# Patient Record
Sex: Female | Born: 1990 | Race: Black or African American | Hispanic: No | Marital: Single | Smoking: Never smoker
Health system: Southern US, Community
[De-identification: ages and names within clinical notes are randomized; demographics above are authoritative.]

## PROBLEM LIST (undated history)

## (undated) DIAGNOSIS — I10 Essential (primary) hypertension: Secondary | ICD-10-CM

## (undated) DIAGNOSIS — B009 Herpesviral infection, unspecified: Secondary | ICD-10-CM

## (undated) HISTORY — PX: WISDOM TOOTH EXTRACTION: SHX21

---

## 2013-09-05 DIAGNOSIS — A499 Bacterial infection, unspecified: Secondary | ICD-10-CM | POA: Insufficient documentation

## 2013-09-05 DIAGNOSIS — Z3202 Encounter for pregnancy test, result negative: Secondary | ICD-10-CM | POA: Insufficient documentation

## 2013-09-05 DIAGNOSIS — N760 Acute vaginitis: Secondary | ICD-10-CM | POA: Insufficient documentation

## 2013-09-05 DIAGNOSIS — B373 Candidiasis of vulva and vagina: Secondary | ICD-10-CM | POA: Insufficient documentation

## 2013-09-05 DIAGNOSIS — B9689 Other specified bacterial agents as the cause of diseases classified elsewhere: Secondary | ICD-10-CM | POA: Insufficient documentation

## 2013-09-05 DIAGNOSIS — N9089 Other specified noninflammatory disorders of vulva and perineum: Secondary | ICD-10-CM | POA: Insufficient documentation

## 2013-09-05 DIAGNOSIS — Z202 Contact with and (suspected) exposure to infections with a predominantly sexual mode of transmission: Secondary | ICD-10-CM | POA: Insufficient documentation

## 2013-09-05 DIAGNOSIS — B3731 Acute candidiasis of vulva and vagina: Secondary | ICD-10-CM | POA: Insufficient documentation

## 2013-09-06 ENCOUNTER — Encounter (HOSPITAL_COMMUNITY): Payer: Self-pay | Admitting: Emergency Medicine

## 2013-09-06 ENCOUNTER — Emergency Department (HOSPITAL_COMMUNITY)
Admission: EM | Admit: 2013-09-06 | Discharge: 2013-09-06 | Disposition: A | Discharge status: Home / Self Care | Payer: 59 | Attending: Emergency Medicine | Admitting: Emergency Medicine

## 2013-09-06 DIAGNOSIS — B373 Candidiasis of vulva and vagina: Secondary | ICD-10-CM

## 2013-09-06 DIAGNOSIS — B3731 Acute candidiasis of vulva and vagina: Secondary | ICD-10-CM

## 2013-09-06 DIAGNOSIS — Z202 Contact with and (suspected) exposure to infections with a predominantly sexual mode of transmission: Secondary | ICD-10-CM

## 2013-09-06 DIAGNOSIS — N760 Acute vaginitis: Secondary | ICD-10-CM

## 2013-09-06 DIAGNOSIS — B9689 Other specified bacterial agents as the cause of diseases classified elsewhere: Secondary | ICD-10-CM

## 2013-09-06 LAB — URINALYSIS, ROUTINE W REFLEX MICROSCOPIC
Bilirubin Urine: NEGATIVE
Glucose, UA: NEGATIVE mg/dL
KETONES UR: NEGATIVE mg/dL
NITRITE: NEGATIVE
PROTEIN: NEGATIVE mg/dL
SPECIFIC GRAVITY, URINE: 1.013 (ref 1.005–1.030)
UROBILINOGEN UA: 0.2 mg/dL (ref 0.0–1.0)
pH: 6.5 (ref 5.0–8.0)

## 2013-09-06 LAB — HIV ANTIBODY (ROUTINE TESTING W REFLEX): HIV 1&2 Ab, 4th Generation: NONREACTIVE

## 2013-09-06 LAB — URINE MICROSCOPIC-ADD ON

## 2013-09-06 LAB — PREGNANCY, URINE: Preg Test, Ur: NEGATIVE

## 2013-09-06 LAB — WET PREP, GENITAL: Trich, Wet Prep: NONE SEEN

## 2013-09-06 LAB — RPR

## 2013-09-06 MED ORDER — AZITHROMYCIN 250 MG PO TABS
1000.0000 mg | ORAL_TABLET | Freq: Once | ORAL | Status: AC
  Administered 2013-09-06: 1000 mg via ORAL
  Filled 2013-09-06: qty 4

## 2013-09-06 MED ORDER — FLUCONAZOLE 100 MG PO TABS
100.0000 mg | ORAL_TABLET | Freq: Once | ORAL | Status: AC
Start: 2013-09-06 — End: 2013-09-06
  Administered 2013-09-06: 100 mg via ORAL
  Filled 2013-09-06: qty 1

## 2013-09-06 MED ORDER — METRONIDAZOLE 500 MG PO TABS
2000.0000 mg | ORAL_TABLET | Freq: Once | ORAL | Status: AC
  Administered 2013-09-06: 2000 mg via ORAL
  Filled 2013-09-06: qty 4

## 2013-09-06 MED ORDER — METRONIDAZOLE 500 MG PO TABS: 500.0000 mg | ORAL_TABLET | Freq: Two times a day (BID) | ORAL | Status: DC

## 2013-09-06 MED ORDER — CEFTRIAXONE SODIUM 250 MG IJ SOLR
250.0000 mg | Freq: Once | INTRAMUSCULAR | Status: AC
  Administered 2013-09-06: 250 mg via INTRAMUSCULAR
  Filled 2013-09-06: qty 250

## 2013-09-06 MED ORDER — FLUCONAZOLE 200 MG PO TABS: 200.0000 mg | ORAL_TABLET | Freq: Every day | ORAL | Status: AC

## 2013-09-06 MED ORDER — VALACYCLOVIR HCL 1 G PO TABS: 1000.0000 mg | ORAL_TABLET | Freq: Three times a day (TID) | ORAL | Status: AC

## 2013-09-06 MED ORDER — DOXYCYCLINE HYCLATE 100 MG PO CAPS: 100.0000 mg | ORAL_CAPSULE | Freq: Two times a day (BID) | ORAL | Status: DC

## 2013-09-06 NOTE — ED Notes (Signed)
For discharge , awaiting for Diflucan tab from pharmacy.

## 2013-09-06 NOTE — Discharge Instructions (Signed)
Please use the medications prescribed and followup with a primary care provider or the [facility] health Department STD clinic for continued evaluation and treatment. Some of your testing is still pending. It is recommended you do not have any sexual intercourse until your symptoms resolve. Informed all of your sexual partners of any positive STD tests so they can be treated.   Bacterial Vaginosis Bacterial vaginosis is an infection of the vagina. It happens when too many of certain germs (bacteria) grow in the vagina. HOME CARE  Take your medicine as told by your doctor.  Finish your medicine even if you start to feel better.  Do not have sex until you finish your medicine and are better.  Tell your sex partner that you have an infection. They should see their doctor for treatment.  Practice safe sex. Use condoms. Have only one sex partner. GET HELP IF:  You are not getting better after 3 days of treatment.  You have more grey fluid (discharge) coming from your vagina than before.  You have more pain than before.  You have a fever. MAKE SURE YOU:   Understand these instructions.  Will watch your condition.  Will get help right away if you are not doing well or get worse. Document Released: 02/21/2008 Document Revised: 03/04/2013 Document Reviewed: 12/24/2012 [facility] Patient Information 2014 [place], [place].   Candidal Vulvovaginitis Candidal vulvovaginitis is an infection of the vagina and vulva. The vulva is the skin around the opening of the vagina. This may cause itching and discomfort in and around the vagina.  HOME CARE  Only take medicine as told by your doctor.  Do not have sex (intercourse) until the infection is healed or as told by your doctor.  Practice safe sex.  Tell your sex partner about your infection.  Do not douche or use tampons.  Wear cotton underwear. Do not wear tight pants or panty hose.  Eat yogurt. This may help treat and prevent  yeast infections. GET HELP RIGHT AWAY IF:   You have a fever.  Your problems get worse during treatment or do not get better in 3 days.  You have discomfort, irritation, or itching in your vagina or vulva area.  You have pain after sex.  You start to get belly (abdominal) pain. MAKE SURE YOU:  Understand these instructions.  Will watch your condition.  Will get help right away if you are not doing well or get worse. Document Released: 08/10/2008 Document Revised: 08/06/2011 Document Reviewed: 08/10/2008 [facility] Patient Information 2014 [place], [place].   Sexually Transmitted Disease A sexually transmitted disease (STD) is a disease or infection that may be passed (transmitted) from person to person, usually during sexual activity. This may happen by way of saliva, semen, blood, vaginal mucus, or urine. Common STDs include:   Gonorrhea.   Chlamydia.   Syphilis.   HIV and AIDS.   Genital herpes.   Hepatitis B and C.   Trichomonas.   Human papillomavirus (HPV).   Pubic lice.   Scabies.  Mites.  Bacterial vaginosis. WHAT ARE CAUSES OF STDs? An STD may be caused by bacteria, a virus, or parasites. STDs are often transmitted during sexual activity if one person is infected. However, they may also be transmitted through nonsexual means. STDs may be transmitted after:   Sexual intercourse with an infected person.   Sharing sex toys with an infected person.   Sharing needles with an infected person or using unclean piercing or tattoo needles.  Having intimate  contact with the genitals, mouth, or rectal areas of an infected person.   Exposure to infected fluids during birth. WHAT ARE THE SIGNS AND SYMPTOMS OF STDs? Different STDs have different symptoms. Some people may not have any symptoms. If symptoms are present, they may include:   Painful or bloody urination.   Pain in the pelvis, abdomen, vagina, anus, throat, or eyes.   Skin rash,  itching, irritation, growths, sores (lesions), ulcerations, or warts in the genital or anal area.  Abnormal vaginal discharge with or without bad odor.   Penile discharge in men.   Fever.   Pain or bleeding during sexual intercourse.   Swollen glands in the groin area.   Yellow skin and eyes (jaundice). This is seen with hepatitis.   Swollen testicles.  Infertility.  Sores and blisters in the mouth. HOW ARE STDs DIAGNOSED? To make a diagnosis, your health care provider may:   Take a medical history.   Perform a physical exam.   Take a sample of any discharge for examination.  Swab the throat, cervix, opening to the penis, rectum, or vagina for testing.  Test a sample of your first morning urine.   Perform blood tests.   Perform a Pap smear, if this applies.   Perform a colposcopy.   Perform a laparoscopy.  HOW ARE STDs TREATED? Treatment depends on the STD. Some STDs may be treated but not cured.   Chlamydia, gonorrhea, trichomonas, and syphilis can be cured with antibiotics.   Genital herpes, hepatitis, and HIV can be treated, but not cured, with prescribed medicines. The medicines lessen symptoms.   Genital warts from HPV can be treated with medicine or by freezing, burning (electrocautery), or surgery. Warts may come back.   HPV cannot be cured with medicine or surgery. However, abnormal areas may be removed from the cervix, vagina, or vulva.   If your diagnosis is confirmed, your recent sexual partners need treatment. This is true even if they are symptom-free or have a negative culture or evaluation. They should not have sex until their health care providers say it is OK. HOW CAN I REDUCE MY RISK OF GETTING AN STD?  Use latex condoms, dental dams, and water-soluble lubricants during sexual activity. Do not use petroleum jelly or oils.  Get vaccinated for HPV and hepatitis. If you have not received these vaccines in the past, talk to your  health care provider about whether one or both might be right for you.   Avoid risky sex practices that can break the skin.  WHAT SHOULD I DO IF I THINK I HAVE AN STD?  See your health care provider.   Inform all sexual partners. They should be tested and treated for any STDs.  Do not have sex until your health care provider says it is OK. WHEN SHOULD I GET HELP? Seek immediate medical care if:  You develop severe abdominal pain.  You are a man and notice swelling or pain in the testicles.  You are a woman and notice swelling or pain in your vagina. Document Released: 08/04/2002 Document Revised: 03/04/2013 Document Reviewed: 12/02/2012 [facility] Patient Information 2014 [place].

## 2013-09-06 NOTE — ED Provider Notes (Signed)
CSN: [id number]     Arrival date & time 09/05/13  2357 History   First MD Initiated Contact with Patient 09/06/13 0234     Chief Complaint  Patient presents with  . Vaginal Pain  . Abdominal Pain   HPI  History provided by the patient. Patient is a 23 year old female with no significant PMH presenting with complaints of pelvic and lower abdominal pains. Symptoms are worsening yesterday. She reports associated rash with burning pain to her genitals for the past 2 days and having a white vaginal discharge. Patient is sexually active with her boyfriend of 3 years. No prior history of STDs or similar symptoms. No other sexual partners. She reports having worsened pain to the skin and genital area after urinating or when in the shower. She has not used any medicines or treatment for her symptoms. No associated fever, chills or sweats. No hematuria or urinary frequency. No nausea or vomiting.   History reviewed. No pertinent past medical history. History reviewed. No pertinent past surgical history. History reviewed. No pertinent family history. History  Substance Use Topics  . Smoking status: Never Smoker   . Smokeless tobacco: Not on file  . Alcohol Use: Yes   OB History   Grav Para Term Preterm Abortions TAB SAB Ect Mult Living                 Review of Systems  Constitutional: Negative for fever, chills, diaphoresis and appetite change.  Gastrointestinal: Positive for abdominal pain. Negative for nausea, vomiting, diarrhea and constipation.  Genitourinary: Positive for dysuria, vaginal discharge and pelvic pain. Negative for hematuria and vaginal bleeding.  All other systems reviewed and are negative.     Allergies  Review of patient's allergies indicates no known allergies.  Home Medications   Current Outpatient Rx  Name  Route  Sig  Dispense  Refill  . ibuprofen (ADVIL,MOTRIN) 200 MG tablet   Oral   Take 400 mg by mouth every 6 (six) hours as needed (pain).           BP 135/100  Pulse 75  Temp(Src) 98.1 F (36.7 C)  Resp 18  SpO2 100%  LMP 08/11/2013 Physical Exam  Nursing note and vitals reviewed. Constitutional: She is oriented to person, place, and time. She appears well-developed and well-nourished. No distress.  HENT:  Head: Normocephalic.  Cardiovascular: Normal rate and regular rhythm.   Pulmonary/Chest: Effort normal and breath sounds normal. No respiratory distress. She has no wheezes. She has no rales.  Abdominal: Soft. She exhibits no distension. There is tenderness in the suprapubic area. There is no rebound and no guarding.  Genitourinary:  Chaperone was present. Numerous lesions throughout the labia majora and minora including some vesicles. There is swelling of the labia minora and around the clitoral hood. Copious amounts of white vaginal discharge. CMT. No adnexal tenderness, mass or fullness. No bleeding.  Neurological: She is alert and oriented to person, place, and time.  Skin: Skin is warm and dry. No rash noted.  Psychiatric: She has a normal mood and affect. Her behavior is normal.    ED Course  Procedures   COORDINATION OF CARE:  Nursing notes reviewed. Vital signs reviewed. Initial pt interview and examination performed.   Filed Vitals:   09/06/13 0017  BP: 135/100  Pulse: 75  Temp: 98.1 F (36.7 C)  Resp: 18  SpO2: 100%    2:50 AM-patient seen and evaluated. Exam concerning for STDs in particular herpes. Will give prophylactic treatment  as well as prescriptions for antivirals and possible associated PID. Patient given referral to [facility] health STD clinic. Patient instructed to inform her sexual partner of diagnosis and of any positive test results. She expressed her understanding.   Treatment plan initiated: Medications  cefTRIAXone (ROCEPHIN) injection 250 mg (250 mg Intramuscular Given 09/06/13 0310)  azithromycin (ZITHROMAX) tablet 1,000 mg (1,000 mg Oral Given 09/06/13 0310)  metroNIDAZOLE  (FLAGYL) tablet 2,000 mg (2,000 mg Oral Given 09/06/13 0310)  fluconazole (DIFLUCAN) tablet 100 mg (100 mg Oral Given 09/06/13 0407)   Results for orders placed during the hospital encounter of 09/06/13  WET PREP, GENITAL      Result Value Ref Range   Yeast Wet Prep HPF POC MANY (*) NONE SEEN   Trich, Wet Prep NONE SEEN  NONE SEEN   Clue Cells Wet Prep HPF POC MANY (*) NONE SEEN   WBC, Wet Prep HPF POC MANY (*) NONE SEEN  URINALYSIS, ROUTINE W REFLEX MICROSCOPIC      Result Value Ref Range   Color, Urine YELLOW  YELLOW   APPearance CLOUDY (*) CLEAR   Specific Gravity, Urine 1.013  1.005 - 1.030   pH 6.5  5.0 - 8.0   Glucose, UA NEGATIVE  NEGATIVE mg/dL   Hgb urine dipstick TRACE (*) NEGATIVE   Bilirubin Urine NEGATIVE  NEGATIVE   Ketones, ur NEGATIVE  NEGATIVE mg/dL   Protein, ur NEGATIVE  NEGATIVE mg/dL   Urobilinogen, UA 0.2  0.0 - 1.0 mg/dL   Nitrite NEGATIVE  NEGATIVE   Leukocytes, UA LARGE (*) NEGATIVE  PREGNANCY, URINE      Result Value Ref Range   Preg Test, Ur NEGATIVE  NEGATIVE  URINE MICROSCOPIC-ADD ON      Result Value Ref Range   Squamous Epithelial / LPF RARE  RARE   WBC, UA TOO NUMEROUS TO COUNT  <3 WBC/hpf   RBC / HPF 3-6  <3 RBC/hpf   Bacteria, UA RARE  RARE       MDM   Final diagnoses:  Possible exposure to STD  Bacterial vaginosis  Yeast vaginitis      Angus Seller, PA-C 09/06/13 0421

## 2013-09-06 NOTE — ED Provider Notes (Signed)
Medical screening examination/treatment/procedure(s) were performed by non-physician practitioner and as supervising physician I was immediately available for consultation/collaboration.    Olivia Mackielga M Shiya Fogelman, MD 09/06/13 [phone]

## 2013-09-06 NOTE — ED Notes (Signed)
Patient is alert and oriented x3.  She is complaining of vaginal pain with lower abdominal pain  That started yesterday.  She admits to having some vaginal discharge.  Currently she rates her  Pain 5 of 10.

## 2013-09-07 LAB — GC/CHLAMYDIA PROBE AMP
CT Probe RNA: NEGATIVE
GC PROBE AMP APTIMA: NEGATIVE

## 2013-09-08 LAB — HSV 1 ANTIBODY, IGG: HSV 1 Glycoprotein G Ab, IgG: 0.16 IV

## 2013-09-08 LAB — URINE CULTURE: Colony Count: 75000

## 2013-09-08 LAB — HSV 2 ANTIBODY, IGG: HSV 2 Glycoprotein G Ab, IgG: <0.1 IV

## 2013-09-09 NOTE — Progress Notes (Addendum)
ED Antimicrobial Stewardship Positive Culture Follow Up   Katrina Bentley is an 23 y.o. female [facility] with a chief complaint of  Chief Complaint  Patient presents with  . Vaginal Pain  . Abdominal Pain    Recent Results (from the past 720 hour(s))  URINE CULTURE     Status: None   Collection Time    09/06/13  1:35 AM      Result Value Ref Range Status   Specimen Description URINE, CLEAN CATCH   Final   Special Requests NONE   Final   Culture  Setup Time     Final   Value: 09/06/2013 14:27     [facility]formed at [facility]   Colony Count     Final   Value: 75,000 COLONIES/ML     [facility]formed at [facility]   Culture     Final   Value: ESCHERICHIA COLI     [facility]formed at [facility]   Report Status 09/08/2013 FINAL   Final   Organism ID, Bacteria ESCHERICHIA COLI   Final  GC/CHLAMYDIA PROBE AMP     Status: None   Collection Time    09/06/13  3:01 AM      Result Value Ref Range Status   CT Probe RNA NEGATIVE  NEGATIVE Final   GC Probe RNA NEGATIVE  NEGATIVE Final   Comment: (NOTE)                                                                                               **Normal Reference Range: Negative**          Assay performed using the Gen-Probe APTIMA COMBO2 (R) Assay.     Acceptable specimen types for this assay include APTIMA Swabs (Unisex,     endocervical, urethral, or vaginal), first void urine, and ThinPrep     liquid based cytology samples.     Performed at [facility]  WET PREP, GENITAL     Status: Abnormal   Collection Time    09/06/13  3:01 AM      Result Value Ref Range Status   Yeast Wet Prep HPF POC MANY (*) NONE SEEN Final   Trich, Wet Prep NONE SEEN  NONE SEEN Final   Clue Cells Wet Prep HPF POC MANY (*) NONE SEEN Final   WBC, Wet Prep HPF POC MANY (*) NONE SEEN Final   Discharged on antibiotics (doxycycline, fluconazole, metronidazole) for PID, possible STD exposure, bacterial  vaginosis, and yeast vaginitis.  Doxycycline does not adequately distribute to urine to cover the E.coli cultured.  New antibiotic prescription: Cipro PO 500mg  twice daily for 3 days  ED Provider: Raymon MuttonMarissa Sciacca, PA-C  Shelba FlakeNathan E. Achilles Dunkope, PharmD Clinical Pharmacist - Resident Pager: [phone] Pharmacy: [phone] 09/09/2013 4:57 PM

## 2013-09-09 NOTE — ED Notes (Signed)
Discharged on antibiotics (doxycycline, fluconazole, metronidazole) for PID, possible STD exposure, bacterial vaginosis, and yeast vaginitis. Doxycycline does not adequately distribute to urine to cover the E.coli cultured.  New antibiotic prescription: Cipro PO 500mg  twice daily for 3 days  ED Provider: Raymon MuttonMarissa Sciacca, PA-C  Shelba FlakeNathan E. Achilles Dunkope, PharmD

## 2013-09-12 ENCOUNTER — Telehealth (HOSPITAL_BASED_OUTPATIENT_CLINIC_OR_DEPARTMENT_OTHER): Payer: Self-pay | Admitting: Emergency Medicine

## 2013-10-14 LAB — OB RESULTS CONSOLE ABO/RH: RH TYPE: POSITIVE

## 2013-10-14 LAB — OB RESULTS CONSOLE ANTIBODY SCREEN: Antibody Screen: NEGATIVE

## 2013-10-14 LAB — OB RESULTS CONSOLE RPR: RPR: NONREACTIVE

## 2013-10-14 LAB — OB RESULTS CONSOLE RUBELLA ANTIBODY, IGM: RUBELLA: IMMUNE

## 2013-10-14 LAB — OB RESULTS CONSOLE GC/CHLAMYDIA
CHLAMYDIA, DNA PROBE: NEGATIVE
Gonorrhea: NEGATIVE

## 2013-10-14 LAB — OB RESULTS CONSOLE HIV ANTIBODY (ROUTINE TESTING): HIV: NONREACTIVE

## 2013-10-14 LAB — OB RESULTS CONSOLE HEPATITIS B SURFACE ANTIGEN: Hepatitis B Surface Ag: NEGATIVE

## 2014-02-25 DIAGNOSIS — B009 Herpesviral infection, unspecified: Secondary | ICD-10-CM

## 2014-02-25 HISTORY — DX: Herpesviral infection, unspecified: B00.9

## 2014-05-28 NOTE — L&D Delivery Note (Signed)
[id number]: Call from nurse stating that patient pushing efforts now have infant at +4 station.  Provider to room.  Infant continue to have decelerations with, slow return to baseline, during maternal pushing efforts.  However, infant showing good response to fetal scalp stimulation. Mother delivered as below with staff encouragement and support.   Delivery Note At 7:48 AM, on Jun 10, 2014, a viable female "Cristal DeerChristopher" was delivered via Vaginal, Spontaneous Delivery (Presentation: Occiput Anterior). Shoulders delivered easily and infant noted to have no tone or spontaneous cry.  Infant immediately placed on mother's abdomen where provider gave bulb suction of mouth and nose.  Infant continue to show signs of distress and cord clamped and cut.  Infant taken to warmer were resuscitative measures initiated by RN and Code Apgar called.  Dr. Wayland DenisJ. Wimmer to room to assess and Infant APGARs: 2, 7; 9. Cord gases collected, but not sent.  Cord blood collected and sent. Placenta delivered spontaneously and noted to be intact with 3VC upon inspection.  To pathology for maternal history of CHTN.  Vaginal inspection revealed bilateral labial and a mild right sided 1st degree perineal laceration.  All lacerations repaired without incident, but additional lidocaine necessary.  Fundus firm, at the umbilicus, and bleeding small.  Mother hemodynamically stable and infant skin to skin prior to provider exit.  Mother unsure of birth control method and opts to breastfeed.  Family wishes for infant to be circumcised, but must confirm with insurance to determine if this will take place in the inpatient or outpatient setting. Infant weight at one hour of life: 8lbs 5.7oz, 20 in  Anesthesia: Epidural  Episiotomy:  None Lacerations:  Bilateral Labial, 1st Degree  Suture Repair: 3.0 vicryl Est. Blood Loss (mL):  200  Mom to postpartum.  Baby to Couplet care / Skin to Skin.  Finis Hendricksen, Joyice FasterJESSICA LYNN, MSN, CNM 06/10/2014, 8:36 AM

## 2014-06-05 ENCOUNTER — Inpatient Hospital Stay (HOSPITAL_COMMUNITY): Payer: 59

## 2014-06-05 ENCOUNTER — Inpatient Hospital Stay (HOSPITAL_COMMUNITY)
Admission: AD | Admit: 2014-06-05 | Discharge: 2014-06-05 | Disposition: A | Discharge status: Home / Self Care | Payer: 59 | Source: Ambulatory Visit | Attending: Obstetrics & Gynecology | Admitting: Obstetrics & Gynecology

## 2014-06-05 ENCOUNTER — Encounter (HOSPITAL_COMMUNITY): Payer: Self-pay

## 2014-06-05 DIAGNOSIS — B9689 Other specified bacterial agents as the cause of diseases classified elsewhere: Secondary | ICD-10-CM | POA: Insufficient documentation

## 2014-06-05 DIAGNOSIS — Z3A38 38 weeks gestation of pregnancy: Secondary | ICD-10-CM | POA: Insufficient documentation

## 2014-06-05 DIAGNOSIS — O9989 Other specified diseases and conditions complicating pregnancy, childbirth and the puerperium: Secondary | ICD-10-CM | POA: Diagnosis present

## 2014-06-05 DIAGNOSIS — O99013 Anemia complicating pregnancy, third trimester: Secondary | ICD-10-CM | POA: Diagnosis not present

## 2014-06-05 DIAGNOSIS — O99820 Streptococcus B carrier state complicating pregnancy: Secondary | ICD-10-CM | POA: Insufficient documentation

## 2014-06-05 DIAGNOSIS — O10013 Pre-existing essential hypertension complicating pregnancy, third trimester: Secondary | ICD-10-CM | POA: Diagnosis not present

## 2014-06-05 DIAGNOSIS — O98313 Other infections with a predominantly sexual mode of transmission complicating pregnancy, third trimester: Secondary | ICD-10-CM | POA: Insufficient documentation

## 2014-06-05 DIAGNOSIS — A6000 Herpesviral infection of urogenital system, unspecified: Secondary | ICD-10-CM | POA: Diagnosis not present

## 2014-06-05 DIAGNOSIS — N760 Acute vaginitis: Secondary | ICD-10-CM | POA: Insufficient documentation

## 2014-06-05 DIAGNOSIS — N898 Other specified noninflammatory disorders of vagina: Secondary | ICD-10-CM | POA: Diagnosis present

## 2014-06-05 DIAGNOSIS — Z22330 Carrier of Group B streptococcus: Secondary | ICD-10-CM

## 2014-06-05 DIAGNOSIS — O23593 Infection of other part of genital tract in pregnancy, third trimester: Secondary | ICD-10-CM | POA: Insufficient documentation

## 2014-06-05 DIAGNOSIS — O139 Gestational [pregnancy-induced] hypertension without significant proteinuria, unspecified trimester: Secondary | ICD-10-CM

## 2014-06-05 DIAGNOSIS — D649 Anemia, unspecified: Secondary | ICD-10-CM | POA: Diagnosis not present

## 2014-06-05 DIAGNOSIS — O10919 Unspecified pre-existing hypertension complicating pregnancy, unspecified trimester: Secondary | ICD-10-CM | POA: Diagnosis present

## 2014-06-05 HISTORY — DX: Herpesviral infection, unspecified: B00.9

## 2014-06-05 HISTORY — DX: Essential (primary) hypertension: I10

## 2014-06-05 LAB — AMNISURE RUPTURE OF MEMBRANE (ROM) NOT AT ARMC: Amnisure ROM: NEGATIVE

## 2014-06-05 LAB — URINALYSIS, ROUTINE W REFLEX MICROSCOPIC
BILIRUBIN URINE: NEGATIVE
Glucose, UA: NEGATIVE mg/dL
Hgb urine dipstick: NEGATIVE
KETONES UR: NEGATIVE mg/dL
Nitrite: NEGATIVE
PH: 7 (ref 5.0–8.0)
PROTEIN: NEGATIVE mg/dL
Specific Gravity, Urine: 1.01 (ref 1.005–1.030)
UROBILINOGEN UA: 0.2 mg/dL (ref 0.0–1.0)

## 2014-06-05 LAB — WET PREP, GENITAL: Trich, Wet Prep: NONE SEEN

## 2014-06-05 LAB — COMPREHENSIVE METABOLIC PANEL
ALK PHOS: 159 U/L — AB (ref 39–117)
ALT: 11 U/L (ref 0–35)
AST: 21 U/L (ref 0–37)
Albumin: 2.9 g/dL — ABNORMAL LOW (ref 3.5–5.2)
Anion gap: 4 — ABNORMAL LOW (ref 5–15)
BUN: <5 mg/dL — ABNORMAL LOW (ref 6–23)
CO2: 23 mmol/L (ref 19–32)
CREATININE: 0.55 mg/dL (ref 0.50–1.10)
Calcium: 8.7 mg/dL (ref 8.4–10.5)
Chloride: 108 mEq/L (ref 96–112)
GFR calc Af Amer: >90 mL/min (ref >90)
GFR calc non Af Amer: >90 mL/min (ref >90)
GLUCOSE: 90 mg/dL (ref 70–99)
POTASSIUM: 3.8 mmol/L (ref 3.5–5.1)
SODIUM: 135 mmol/L (ref 135–145)
Total Bilirubin: 1.2 mg/dL (ref 0.3–1.2)
Total Protein: 6 g/dL (ref 6.0–8.3)

## 2014-06-05 LAB — URINE MICROSCOPIC-ADD ON

## 2014-06-05 LAB — CBC
HEMATOCRIT: 33.6 % — AB (ref 36.0–46.0)
Hemoglobin: 10.2 g/dL — ABNORMAL LOW (ref 12.0–15.0)
MCH: 23.4 pg — ABNORMAL LOW (ref 26.0–34.0)
MCHC: 30.4 g/dL (ref 30.0–36.0)
MCV: 77.2 fL — AB (ref 78.0–100.0)
PLATELETS: 243 10*3/uL (ref 150–400)
RBC: 4.35 MIL/uL (ref 3.87–5.11)
RDW: 19.1 % — AB (ref 11.5–15.5)
WBC: 6.8 10*3/uL (ref 4.0–10.5)

## 2014-06-05 LAB — PROTEIN / CREATININE RATIO, URINE
CREATININE, URINE: 133 mg/dL
Protein Creatinine Ratio: 0.06 (ref 0.00–0.15)
Total Protein, Urine: 8 mg/dL

## 2014-06-05 LAB — OB RESULTS CONSOLE GBS: GBS: POSITIVE

## 2014-06-05 LAB — LACTATE DEHYDROGENASE: LDH: 153 U/L (ref 94–250)

## 2014-06-05 LAB — URIC ACID: Uric Acid, Serum: 4.4 mg/dL (ref 2.4–7.0)

## 2014-06-05 MED ORDER — LABETALOL HCL 100 MG PO TABS
100.0000 mg | ORAL_TABLET | Freq: Once | ORAL | Status: AC
  Administered 2014-06-05: 100 mg via ORAL
  Filled 2014-06-05: qty 1

## 2014-06-05 MED ORDER — METRONIDAZOLE 500 MG PO TABS: 500.0000 mg | ORAL_TABLET | Freq: Two times a day (BID) | ORAL | Status: AC

## 2014-06-05 MED ORDER — LABETALOL HCL 100 MG PO TABS: 100.0000 mg | ORAL_TABLET | Freq: Once | ORAL | Status: DC

## 2014-06-05 NOTE — MAU Provider Note (Signed)
History   24 yo G1P0 at 66 3/7 weeks presented after calling with ? Leaking at times over last several days.  Denies contractions, bleeding, HA, visual sx, or epigastric pain.  No recent IC.    Patient Active Problem List   Diagnosis Date Noted  . Vaginal discharge 06/05/2014  . 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 06/05/2014  . Anemia 06/05/2014  . Chronic hypertension during pregnancy, antepartum 06/05/2014  No elevation of BP during pregnancy.  Reports hx of elevated BP at times in past, usually when stressed, but no meds.  Chief Complaint  Patient presents with  . Vaginal Discharge   HPI:  See above  OB History    Gravida Para Term Preterm AB TAB SAB Ectopic Multiple Living   1               Past Medical History  Diagnosis Date  . Hypertension     Hx in past, when stressed.  No meds  . 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 Wisdom tooth extraction      History reviewed. No pertinent family history.  History  Substance Use Topics  . Smoking status: Never Smoker   . Smokeless tobacco: Not on file  . Alcohol Use: Yes    Allergies: No Known Allergies  Prescriptions prior to admission  Medication Sig Dispense Refill Last Dose  . ferrous sulfate 325 (65 FE) MG tablet Take 325 mg by mouth daily with breakfast.   Past Week at Unknown time  . Prenatal Vit-Fe Fumarate-FA (PRENATAL MULTIVITAMIN) TABS tablet Take 1 tablet by mouth daily at 12 noon.   Past Week at Unknown time  . valACYclovir (VALTREX) 500 MG tablet Take 500 mg by mouth daily.   06/04/2014 at Unknown time  . doxycycline (VIBRAMYCIN) 100 MG capsule Take 1 capsule (100 mg total) by mouth 2 (two) times daily. (Patient not taking: Reported on 06/05/2014) 20 capsule 0   . ibuprofen (ADVIL,MOTRIN) 200 MG tablet Take 400 mg by mouth every 6 (six) hours as needed (pain).   prn  . metroNIDAZOLE (FLAGYL) 500 MG tablet Take 1  tablet (500 mg total) by mouth 2 (two) times daily. One po bid x 7 days (Patient not taking: Reported on 06/05/2014) 14 tablet 0     ROS:  Vaginal d/c, +FM Physical Exam   Blood pressure 135/99, pulse 79, temperature 98 F (36.7 C), temperature source Oral, resp. rate 18, last menstrual period 08/11/2013.   Filed Vitals:   06/05/14 1750 06/05/14 1802 06/05/14 1817 06/05/14 1832  BP: 134/92 129/93 126/91 135/99  Pulse: 61 72 79 79  Temp:      TempSrc:      Resp:        Physical Exam  In NAD Chest clear Heart RRR without murmur Abd gravid, NT Pelvic--deferred at present Ext DTR 1+, no clonus, no edema  FHR initially Category 1, with scattered contractions.  Then had 2 late decels at separate time, with Category 1 prior to and subsequent to late. Currently Category 1 UC very occasional  Amnisure negative Wet prep + BV  ED Course  Assessment: IUP at 38 3/7 weeks No evidence SROM Elevated BP--hx chronic hypertension, no meds Positive GBS Hx HSV, no lesions or prodrome Anemia  Plan: [facility] labs  PCR   Nigel Bridgeman CNM, MSN 06/05/2014 7:00 PM   Addendum:  Earlyne Iba  with Dr. Charlotta Newtonzan [facility], will plan d/c home after BPP/AFI. If [facility] labs/PCR are abnormal, will anticipate at least7p observation, if not admission. BPP/AFI. Gerrit HeckJessica Emly, CNM, will f/u.  Nigel BridgemanVicki Aminta Sakurai, CNM 06/05/14 7p

## 2014-06-05 NOTE — Discharge Instructions (Signed)

## 2014-06-05 NOTE — MAU Provider Note (Signed)
History   Continuation and Completion of visit started by V. Emilee Hero, CNM -Katrina Bentley is a 23y.o. G1P0 at 38.3wks who presented for vaginal leakage.  Amniosure returned negative and patient found to have BV on wet prep.  During visit, patient having some elevated diastolic blood pressures and reports history of "being told my blood pressure was high prior to pregnancy," but never required medications.  Patient denies HA, visual disturbance, epigastric pain, edema, and N/V.  PIH Labs negative and PC Ratio 0.06. Patient reports active fetus and denies VB or contractions.   Patient Active Problem List   Diagnosis Date Noted  . Vaginal discharge 06/05/2014  . 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 06/05/2014  . Anemia 06/05/2014  . Chronic hypertension during pregnancy, antepartum 06/05/2014    Chief Complaint  Patient presents with  . Vaginal Discharge   HPI  OB History    Gravida Para Term Preterm AB TAB SAB Ectopic Multiple Living   1               Past Medical History  Diagnosis Date  . Hypertension     Hx in past, when stressed.  No meds  . 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 Wisdom tooth extraction      History reviewed. No pertinent family history.  History  Substance Use Topics  . Smoking status: Never Smoker   . Smokeless tobacco: Not on file  . Alcohol Use: Yes    Allergies: No Known Allergies  Prescriptions prior to admission  Medication Sig Dispense Refill Last Dose  . ferrous sulfate 325 (65 FE) MG tablet Take 325 mg by mouth daily with breakfast.   Past Week at Unknown time  . Prenatal Vit-Fe Fumarate-FA (PRENATAL MULTIVITAMIN) TABS tablet Take 1 tablet by mouth daily at 12 noon.   Past Week at Unknown time  . valACYclovir (VALTREX) 500 MG tablet Take 500 mg by mouth daily.   06/04/2014 at Unknown time  . doxycycline (VIBRAMYCIN) 100 MG capsule Take 1 capsule  (100 mg total) by mouth 2 (two) times daily. (Patient not taking: Reported on 06/05/2014) 20 capsule 0   . ibuprofen (ADVIL,MOTRIN) 200 MG tablet Take 400 mg by mouth every 6 (six) hours as needed (pain).   prn  . metroNIDAZOLE (FLAGYL) 500 MG tablet Take 1 tablet (500 mg total) by mouth 2 (two) times daily. One po bid x 7 days (Patient not taking: Reported on 06/05/2014) 14 tablet 0     ROS  See HPI Above Physical Exam   Blood pressure 140/106, pulse 74, temperature 98 F (36.7 C), temperature source Oral, resp. rate 18, last menstrual period 08/11/2013. Filed Vitals:   06/05/14 2049 06/05/14 2052 06/05/14 2057 06/05/14 2102  BP: 132/96 131/88 122/86 120/88  Pulse: 73 72 76 88  Temp:      TempSrc:      Resp:       Results for orders placed or performed during the hospital encounter of 06/05/14 (from the past 24 hour(s))  Amnisure rupture of membrane (rom)     Status: None   Collection Time: 06/05/14  5:48 PM  Result Value Ref Range   Amnisure ROM NEGATIVE   Wet prep, genital     Status: Abnormal   Collection Time: 06/05/14  5:48 PM  Result Value Ref Range   Yeast Wet Prep HPF POC FEW (A)  NONE SEEN   Trich, Wet Prep NONE SEEN NONE SEEN   Clue Cells Wet Prep HPF POC MODERATE (A) NONE SEEN   WBC, Wet Prep HPF POC MANY (A) NONE SEEN  CBC     Status: Abnormal   Collection Time: 06/05/14  6:20 PM  Result Value Ref Range   WBC 6.8 4.0 - 10.5 K/uL   RBC 4.35 3.87 - 5.11 MIL/uL   Hemoglobin 10.2 (L) 12.0 - 15.0 g/dL   HCT 16.133.6 (L) 09.636.0 - 04.546.0 %   MCV 77.2 (L) 78.0 - 100.0 fL   MCH 23.4 (L) 26.0 - 34.0 pg   MCHC 30.4 30.0 - 36.0 g/dL   RDW 40.919.1 (H) 81.111.5 - 91.415.5 %   Platelets 243 150 - 400 K/uL  Comprehensive metabolic panel     Status: Abnormal   Collection Time: 06/05/14  6:20 PM  Result Value Ref Range   Sodium 135 135 - 145 mmol/L   Potassium 3.8 3.5 - 5.1 mmol/L   Chloride 108 96 - 112 mEq/L   CO2 23 19 - 32 mmol/L   Glucose, Bld 90 70 - 99 mg/dL   BUN <5 (L) 6 - 23 mg/dL    Creatinine, Ser 7.820.55 0.50 - 1.10 mg/dL   Calcium 8.7 8.4 - 95.610.5 mg/dL   Total Protein 6.0 6.0 - 8.3 g/dL   Albumin 2.9 (L) 3.5 - 5.2 g/dL   AST 21 0 - 37 U/L   ALT 11 0 - 35 U/L   Alkaline Phosphatase 159 (H) 39 - 117 U/L   Total Bilirubin 1.2 0.3 - 1.2 mg/dL   GFR calc non Af Amer >90 >90 mL/min   GFR calc Af Amer >90 >90 mL/min   Anion gap 4 (L) 5 - 15  Lactate dehydrogenase     Status: None   Collection Time: 06/05/14  6:20 PM  Result Value Ref Range   LDH 153 94 - 250 U/L  Uric acid     Status: None   Collection Time: 06/05/14  6:20 PM  Result Value Ref Range   Uric Acid, Serum 4.4 2.4 - 7.0 mg/dL  Urinalysis, Routine w reflex microscopic     Status: Abnormal   Collection Time: 06/05/14  6:35 PM  Result Value Ref Range   Color, Urine YELLOW YELLOW   APPearance CLEAR CLEAR   Specific Gravity, Urine 1.010 1.005 - 1.030   pH 7.0 5.0 - 8.0   Glucose, UA NEGATIVE NEGATIVE mg/dL   Hgb urine dipstick NEGATIVE NEGATIVE   Bilirubin Urine NEGATIVE NEGATIVE   Ketones, ur NEGATIVE NEGATIVE mg/dL   Protein, ur NEGATIVE NEGATIVE mg/dL   Urobilinogen, UA 0.2 0.0 - 1.0 mg/dL   Nitrite NEGATIVE NEGATIVE   Leukocytes, UA SMALL (A) NEGATIVE  Protein / creatinine ratio, urine     Status: None   Collection Time: 06/05/14  6:35 PM  Result Value Ref Range   Creatinine, Urine 133.00 mg/dL   Total Protein, Urine 8 mg/dL   Protein Creatinine Ratio 0.06 0.00 - 0.15  Urine microscopic-add on     Status: Abnormal   Collection Time: 06/05/14  6:35 PM  Result Value Ref Range   Squamous Epithelial / LPF RARE RARE   WBC, UA 3-6 <3 WBC/hpf   RBC / HPF 0-2 <3 RBC/hpf   Bacteria, UA FEW (A) RARE   BPP: 8/8 AFI: Subjectively Low Normal  Physical Exam Deferred FHR: 135 bpm, Mod Var, -Decels, +Accels UC: None ED Course  Assessment: IUP at  38.3wks Cat I FT Bacterial Vaginosis Elevated BP  Plan: -BPP completed  -Dr. Katharine Look consulted regarding BP and advised as below -Give Labetalol   now and prescribe for daily usage -Schedule for f/u appt this Monday -Discussed POC with patient who agrees and verbalizes understanding -RX for Flagyl  BID x 7days -Rx for Labetalol  daily -Keep appt as scheduled: 06/07/2014 -Encouraged to call if any questions or concerns arise prior to next scheduled office visit.  -Discharged to home in good condition  Gyneth Hubka LYNN CNM, MSN 06/05/2014 8:59 PM

## 2014-06-05 NOTE — MAU Note (Signed)
Pt presents complaining of possible ROM. States she has had some leaking for a few days but decided to come to be evaluated because there were wet spots in the bed. Denies vaginal bleeding. Reports good fetal movement.

## 2014-06-06 LAB — TSH: TSH: 4.545 u[IU]/mL — AB (ref 0.350–4.500)

## 2014-06-07 DIAGNOSIS — Z3A38 38 weeks gestation of pregnancy: Secondary | ICD-10-CM | POA: Insufficient documentation

## 2014-06-09 ENCOUNTER — Inpatient Hospital Stay (HOSPITAL_COMMUNITY)
Admission: AD | Admit: 2014-06-09 | Discharge: 2014-06-12 | DRG: 774 | Disposition: A | Discharge status: Home / Self Care | Payer: 59 | Source: Ambulatory Visit | Attending: Obstetrics and Gynecology | Admitting: Obstetrics and Gynecology

## 2014-06-09 DIAGNOSIS — A6000 Herpesviral infection of urogenital system, unspecified: Secondary | ICD-10-CM | POA: Diagnosis present

## 2014-06-09 DIAGNOSIS — Z22330 Carrier of Group B streptococcus: Secondary | ICD-10-CM

## 2014-06-09 DIAGNOSIS — O10919 Unspecified pre-existing hypertension complicating pregnancy, unspecified trimester: Secondary | ICD-10-CM | POA: Diagnosis present

## 2014-06-09 DIAGNOSIS — O99824 Streptococcus B carrier state complicating childbirth: Secondary | ICD-10-CM | POA: Diagnosis present

## 2014-06-09 DIAGNOSIS — O1092 Unspecified pre-existing hypertension complicating childbirth: Secondary | ICD-10-CM | POA: Diagnosis present

## 2014-06-09 DIAGNOSIS — Z3A39 39 weeks gestation of pregnancy: Secondary | ICD-10-CM | POA: Diagnosis present

## 2014-06-09 DIAGNOSIS — O9832 Other infections with a predominantly sexual mode of transmission complicating childbirth: Secondary | ICD-10-CM | POA: Diagnosis present

## 2014-06-09 DIAGNOSIS — D649 Anemia, unspecified: Secondary | ICD-10-CM | POA: Diagnosis present

## 2014-06-09 DIAGNOSIS — O9081 Anemia of the puerperium: Secondary | ICD-10-CM | POA: Diagnosis present

## 2014-06-10 ENCOUNTER — Encounter (HOSPITAL_COMMUNITY): Payer: Self-pay | Admitting: *Deleted

## 2014-06-10 ENCOUNTER — Inpatient Hospital Stay (HOSPITAL_COMMUNITY): Payer: 59 | Admitting: Anesthesiology

## 2014-06-10 DIAGNOSIS — A6000 Herpesviral infection of urogenital system, unspecified: Secondary | ICD-10-CM | POA: Diagnosis present

## 2014-06-10 DIAGNOSIS — D649 Anemia, unspecified: Secondary | ICD-10-CM | POA: Diagnosis present

## 2014-06-10 DIAGNOSIS — O99824 Streptococcus B carrier state complicating childbirth: Secondary | ICD-10-CM | POA: Diagnosis present

## 2014-06-10 DIAGNOSIS — O9081 Anemia of the puerperium: Secondary | ICD-10-CM | POA: Diagnosis present

## 2014-06-10 DIAGNOSIS — O1092 Unspecified pre-existing hypertension complicating childbirth: Secondary | ICD-10-CM | POA: Diagnosis present

## 2014-06-10 DIAGNOSIS — O9832 Other infections with a predominantly sexual mode of transmission complicating childbirth: Secondary | ICD-10-CM | POA: Diagnosis present

## 2014-06-10 DIAGNOSIS — Z3A39 39 weeks gestation of pregnancy: Secondary | ICD-10-CM | POA: Diagnosis present

## 2014-06-10 LAB — PROTEIN / CREATININE RATIO, URINE
Creatinine, Urine: 201 mg/dL
Protein Creatinine Ratio: 0.11 (ref 0.00–0.15)
Total Protein, Urine: 23 mg/dL

## 2014-06-10 LAB — COMPREHENSIVE METABOLIC PANEL
ALBUMIN: 3 g/dL — AB (ref 3.5–5.2)
ALT: 12 U/L (ref 0–35)
AST: 25 U/L (ref 0–37)
Alkaline Phosphatase: 155 U/L — ABNORMAL HIGH (ref 39–117)
Anion gap: 8 (ref 5–15)
BUN: 6 mg/dL (ref 6–23)
CHLORIDE: 104 meq/L (ref 96–112)
CO2: 23 mmol/L (ref 19–32)
Calcium: 9.5 mg/dL (ref 8.4–10.5)
Creatinine, Ser: 0.63 mg/dL (ref 0.50–1.10)
GFR calc Af Amer: >90 mL/min (ref >90)
GFR calc non Af Amer: >90 mL/min (ref >90)
GLUCOSE: 83 mg/dL (ref 70–99)
POTASSIUM: 4.4 mmol/L (ref 3.5–5.1)
Sodium: 135 mmol/L (ref 135–145)
Total Bilirubin: 1.4 mg/dL — ABNORMAL HIGH (ref 0.3–1.2)
Total Protein: 6.7 g/dL (ref 6.0–8.3)

## 2014-06-10 LAB — CBC
HCT: 36.4 % (ref 36.0–46.0)
HEMOGLOBIN: 11 g/dL — AB (ref 12.0–15.0)
MCH: 23.5 pg — ABNORMAL LOW (ref 26.0–34.0)
MCHC: 30.2 g/dL (ref 30.0–36.0)
MCV: 77.8 fL — ABNORMAL LOW (ref 78.0–100.0)
PLATELETS: 286 10*3/uL (ref 150–400)
RBC: 4.68 MIL/uL (ref 3.87–5.11)
RDW: 19.7 % — ABNORMAL HIGH (ref 11.5–15.5)
WBC: 11.8 10*3/uL — ABNORMAL HIGH (ref 4.0–10.5)

## 2014-06-10 LAB — ABO/RH: ABO/RH(D): B POS

## 2014-06-10 LAB — TYPE AND SCREEN
ABO/RH(D): B POS
ANTIBODY SCREEN: NEGATIVE

## 2014-06-10 LAB — LACTATE DEHYDROGENASE: LDH: 192 U/L (ref 94–250)

## 2014-06-10 LAB — URIC ACID: Uric Acid, Serum: 4.3 mg/dL (ref 2.4–7.0)

## 2014-06-10 MED ORDER — OXYCODONE-ACETAMINOPHEN 5-325 MG PO TABS: 1.0000 | ORAL_TABLET | ORAL | Status: DC | PRN

## 2014-06-10 MED ORDER — OXYTOCIN 40 UNITS IN LACTATED RINGERS INFUSION - SIMPLE MED
62.5000 mL/h | INTRAVENOUS | Status: DC
  Administered 2014-06-10: 999 mL/h via INTRAVENOUS
  Filled 2014-06-10: qty 1000

## 2014-06-10 MED ORDER — LACTATED RINGERS IV SOLN
500.0000 mL | Freq: Once | INTRAVENOUS | Status: AC
  Administered 2014-06-10: 500 mL via INTRAVENOUS

## 2014-06-10 MED ORDER — PHENYLEPHRINE 40 MCG/ML (10ML) SYRINGE FOR IV PUSH (FOR BLOOD PRESSURE SUPPORT)
80.0000 ug | PREFILLED_SYRINGE | INTRAVENOUS | Status: DC | PRN
  Filled 2014-06-10: qty 2

## 2014-06-10 MED ORDER — BENZOCAINE-MENTHOL 20-0.5 % EX AERO
1.0000 "application " | INHALATION_SPRAY | CUTANEOUS | Status: DC | PRN
  Administered 2014-06-10: 1 via TOPICAL
  Filled 2014-06-10: qty 56

## 2014-06-10 MED ORDER — LACTATED RINGERS IV SOLN
INTRAVENOUS | Status: DC
  Administered 2014-06-10: 02:00:00 via INTRAVENOUS

## 2014-06-10 MED ORDER — FENTANYL 2.5 MCG/ML BUPIVACAINE 1/10 % EPIDURAL INFUSION (WH - ANES)
14.0000 mL/h | INTRAMUSCULAR | Status: DC | PRN
  Administered 2014-06-10: 14 mL/h via EPIDURAL

## 2014-06-10 MED ORDER — ACETAMINOPHEN 325 MG PO TABS: 650.0000 mg | ORAL_TABLET | ORAL | Status: DC | PRN

## 2014-06-10 MED ORDER — SIMETHICONE 80 MG PO CHEW
80.0000 mg | CHEWABLE_TABLET | ORAL | Status: DC | PRN
Start: 2014-06-10 — End: 2014-06-12

## 2014-06-10 MED ORDER — SENNOSIDES-DOCUSATE SODIUM 8.6-50 MG PO TABS
2.0000 | ORAL_TABLET | ORAL | Status: DC
Start: 2014-06-11 — End: 2014-06-12
  Administered 2014-06-10 – 2014-06-12 (×2): 2 via ORAL
  Filled 2014-06-10 (×2): qty 2

## 2014-06-10 MED ORDER — OXYTOCIN BOLUS FROM INFUSION: 500.0000 mL | INTRAVENOUS | Status: DC

## 2014-06-10 MED ORDER — LABETALOL HCL 100 MG PO TABS: 100.0000 mg | ORAL_TABLET | Freq: Every day | ORAL | Status: DC

## 2014-06-10 MED ORDER — ONDANSETRON HCL 4 MG/2ML IJ SOLN: 4.0000 mg | Freq: Four times a day (QID) | INTRAMUSCULAR | Status: DC | PRN

## 2014-06-10 MED ORDER — WITCH HAZEL-GLYCERIN EX PADS: 1.0000 "application " | MEDICATED_PAD | CUTANEOUS | Status: DC | PRN

## 2014-06-10 MED ORDER — OXYCODONE-ACETAMINOPHEN 5-325 MG PO TABS
1.0000 | ORAL_TABLET | Freq: Once | ORAL | Status: AC
  Administered 2014-06-10: 2 via ORAL
  Filled 2014-06-10: qty 2

## 2014-06-10 MED ORDER — LIDOCAINE HCL (PF) 1 % IJ SOLN
INTRAMUSCULAR | Status: DC | PRN
  Administered 2014-06-10: 5 mL
  Administered 2014-06-10: 3 mL
  Administered 2014-06-10: 5 mL

## 2014-06-10 MED ORDER — LIDOCAINE HCL (PF) 1 % IJ SOLN
30.0000 mL | INTRAMUSCULAR | Status: AC | PRN
  Administered 2014-06-10: 30 mL via SUBCUTANEOUS
  Filled 2014-06-10: qty 30

## 2014-06-10 MED ORDER — NALBUPHINE HCL 10 MG/ML IJ SOLN: 5.0000 mg | INTRAMUSCULAR | Status: DC | PRN

## 2014-06-10 MED ORDER — ONDANSETRON HCL 4 MG/2ML IJ SOLN
4.0000 mg | INTRAMUSCULAR | Status: DC | PRN
Start: 2014-06-10 — End: 2014-06-12

## 2014-06-10 MED ORDER — FENTANYL 2.5 MCG/ML BUPIVACAINE 1/10 % EPIDURAL INFUSION (WH - ANES)
INTRAMUSCULAR | Status: AC
  Filled 2014-06-10: qty 125

## 2014-06-10 MED ORDER — FLEET ENEMA 7-19 GM/118ML RE ENEM: 1.0000 | ENEMA | RECTAL | Status: DC | PRN

## 2014-06-10 MED ORDER — OXYCODONE-ACETAMINOPHEN 5-325 MG PO TABS: 2.0000 | ORAL_TABLET | ORAL | Status: DC | PRN

## 2014-06-10 MED ORDER — DIBUCAINE 1 % RE OINT: 1.0000 "application " | TOPICAL_OINTMENT | RECTAL | Status: DC | PRN

## 2014-06-10 MED ORDER — LACTATED RINGERS IV SOLN
INTRAVENOUS | Status: DC
  Administered 2014-06-10: 04:00:00 via INTRAVENOUS

## 2014-06-10 MED ORDER — ZOLPIDEM TARTRATE 5 MG PO TABS: 5.0000 mg | ORAL_TABLET | Freq: Every evening | ORAL | Status: DC | PRN

## 2014-06-10 MED ORDER — IBUPROFEN 600 MG PO TABS
600.0000 mg | ORAL_TABLET | Freq: Four times a day (QID) | ORAL | Status: DC
  Administered 2014-06-10 – 2014-06-12 (×7): 600 mg via ORAL
  Filled 2014-06-10 (×7): qty 1

## 2014-06-10 MED ORDER — DIPHENHYDRAMINE HCL 25 MG PO CAPS: 25.0000 mg | ORAL_CAPSULE | Freq: Four times a day (QID) | ORAL | Status: DC | PRN

## 2014-06-10 MED ORDER — FENTANYL 2.5 MCG/ML BUPIVACAINE 1/10 % EPIDURAL INFUSION (WH - ANES)
INTRAMUSCULAR | Status: DC | PRN
  Administered 2014-06-10: 14 mL/h via EPIDURAL

## 2014-06-10 MED ORDER — TETANUS-DIPHTH-ACELL PERTUSSIS 5-2.5-18.5 LF-MCG/0.5 IM SUSP: 0.5000 mL | Freq: Once | INTRAMUSCULAR | Status: DC

## 2014-06-10 MED ORDER — ONDANSETRON HCL 4 MG PO TABS: 4.0000 mg | ORAL_TABLET | ORAL | Status: DC | PRN

## 2014-06-10 MED ORDER — EPHEDRINE 5 MG/ML INJ
10.0000 mg | INTRAVENOUS | Status: DC | PRN
  Filled 2014-06-10: qty 2

## 2014-06-10 MED ORDER — LACTATED RINGERS IV SOLN: 500.0000 mL | INTRAVENOUS | Status: DC | PRN

## 2014-06-10 MED ORDER — PENICILLIN G POTASSIUM 5000000 UNITS IJ SOLR
5.0000 10*6.[IU] | Freq: Once | INTRAVENOUS | Status: DC
  Filled 2014-06-10: qty 5

## 2014-06-10 MED ORDER — PHENYLEPHRINE 40 MCG/ML (10ML) SYRINGE FOR IV PUSH (FOR BLOOD PRESSURE SUPPORT)
PREFILLED_SYRINGE | INTRAVENOUS | Status: AC
  Filled 2014-06-10: qty 20

## 2014-06-10 MED ORDER — PRENATAL MULTIVITAMIN CH: 1.0000 | ORAL_TABLET | Freq: Every day | ORAL | Status: DC

## 2014-06-10 MED ORDER — DIPHENHYDRAMINE HCL 50 MG/ML IJ SOLN: 12.5000 mg | INTRAMUSCULAR | Status: DC | PRN

## 2014-06-10 MED ORDER — LANOLIN HYDROUS EX OINT: TOPICAL_OINTMENT | CUTANEOUS | Status: DC | PRN

## 2014-06-10 MED ORDER — ONDANSETRON HCL 4 MG/2ML IJ SOLN
4.0000 mg | Freq: Once | INTRAMUSCULAR | Status: AC
  Administered 2014-06-10: 4 mg via INTRAVENOUS
  Filled 2014-06-10: qty 2

## 2014-06-10 MED ORDER — VALACYCLOVIR HCL 500 MG PO TABS
500.0000 mg | ORAL_TABLET | Freq: Every day | ORAL | Status: DC
  Administered 2014-06-11 – 2014-06-12 (×2): 500 mg via ORAL
  Filled 2014-06-10 (×3): qty 1

## 2014-06-10 MED ORDER — LABETALOL HCL 100 MG PO TABS
100.0000 mg | ORAL_TABLET | Freq: Once | ORAL | Status: AC
  Administered 2014-06-10: 100 mg via ORAL
  Filled 2014-06-10: qty 1

## 2014-06-10 MED ORDER — CITRIC ACID-SODIUM CITRATE 334-500 MG/5ML PO SOLN: 30.0000 mL | ORAL | Status: DC | PRN

## 2014-06-10 MED ORDER — PENICILLIN G POTASSIUM 5000000 UNITS IJ SOLR
2.5000 10*6.[IU] | INTRAVENOUS | Status: DC
  Filled 2014-06-10 (×3): qty 2.5

## 2014-06-10 NOTE — MAU Note (Signed)
Pt reports contractions every 5-7 minutes since 2000.

## 2014-06-10 NOTE — Progress Notes (Signed)
CNM notified, by telephone call, of trends in blood pressure elevation. Patient had dose of Labetalol ordered for earlier today, that will be given now, and continued medication orders will be entered by CNM. Discussed plan of care with patient.

## 2014-06-10 NOTE — Anesthesia Preprocedure Evaluation (Signed)
Anesthesia Evaluation  Patient identified by MRN, date of birth, ID band Patient awake    Reviewed: Allergy & Precautions, H&P , NPO status , Patient's Chart, lab work & pertinent test results  History of Anesthesia Complications Negative for: history of anesthetic complications  Airway Mallampati: II TM Distance: >3 FB Neck ROM: full    Dental no notable dental hx. (+) Teeth Intact   Pulmonary neg pulmonary ROS,  breath sounds clear to auscultation  Pulmonary exam normal       Cardiovascular hypertension, negative cardio ROS  Rhythm:regular Rate:Normal     Neuro/Psych negative neurological ROS  negative psych ROS   GI/Hepatic negative GI ROS, Neg liver ROS,   Endo/Other  negative endocrine ROS  Renal/GU negative Renal ROS  negative genitourinary   Musculoskeletal   Abdominal Normal abdominal exam  (+)   Peds  Hematology negative hematology ROS (+)   Anesthesia Other Findings   Reproductive/Obstetrics (+) Pregnancy                           Anesthesia Physical Anesthesia Plan  ASA: II  Anesthesia Plan: Epidural   Post-op Pain Management:    Induction:   Airway Management Planned:   Additional Equipment:   Intra-op Plan:   Post-operative Plan:   Informed Consent: I have reviewed the patients History and Physical, chart, labs and discussed the procedure including the risks, benefits and alternatives for the proposed anesthesia with the patient or authorized representative who has indicated his/her understanding and acceptance.     Plan Discussed with:   Anesthesia Plan Comments:         Anesthesia Quick Evaluation  

## 2014-06-10 NOTE — Anesthesia Procedure Notes (Signed)
Epidural Patient location during procedure: OB  Staffing Anesthesiologist: Andalyn Heckstall Performed by: anesthesiologist   Preanesthetic Checklist Completed: patient identified, site marked, surgical consent, pre-op evaluation, timeout performed, IV checked, risks and benefits discussed and monitors and equipment checked  Epidural Patient position: sitting Prep: ChloraPrep Patient monitoring: heart rate, continuous pulse ox and blood pressure Approach: right paramedian Location: L4-L5 Injection technique: LOR saline  Needle:  Needle type: Tuohy  Needle gauge: 17 G Needle length: 9 cm and 9 Needle insertion depth: 7 cm Catheter type: closed end flexible Catheter size: 20 Guage Catheter at skin depth: 12 cm Test dose: negative  Assessment Events: blood not aspirated, injection not painful, no injection resistance, negative IV test and no paresthesia  Additional Notes   Patient tolerated the insertion well without complications.   

## 2014-06-10 NOTE — H&P (Signed)
Katrina Bentley is a 24 y.o. female, G1P0 at 39.1 weeks who presents to MAU for regular, painful ctxs, every 7-10 minutes since 09:00 PM on 06/09/14. Reports active fetus. Denies LOF or VB.  Cvx 1/90/-2 initially; was given 1 percocet tab which she immediately vomited. Cvx re-evaluated shortly thereafter and noted to be 3 cm per RN. Pt admitted secondary to rapid cervical progression.     Patient Active Problem List   Diagnosis Date Noted  . Normal labor 06/10/2014  . 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 06/05/2014  . Anemia 06/05/2014  . Chronic hypertension during pregnancy, antepartum 06/05/2014    History of present pregnancy: Patient entered care at 10.5 weeks.   EDC of 06/16/14 was established by sure LMP.   Anatomy scan: 19 weeks, with normal findings and an anterior placenta w/o previa.   Additional US evaluations: 12.6 wks: SIUP, +FHTs, anteverted uterus, anterior appearing placenta, +amnion, normal fluid, CRL c/w LMP. NT normal, cvx closed, normal appearing adnexa, LTOV not visualized.   Significant prenatal events: Self treatment of symptomatic yeast with OTC Monistat which caused bleeding at 27+ wks; was later treated w/ Terazol with relief at f/u appt. Dx'd w/ HSV at 27.5 wks, given treatment at that time, then daily suppression at 36 wks. Struggled w/ back pain in the 3rd trimester - comfort measures reviewed. Reported decreased FM at 37.5 wk office visit - NST reactive with pt reporting FM during NST. Seen in MAU at 38.3 wks for possible ROM (amnisure neg). Was given treatment for BV and Labetalol for persistent elevated pressures; neg preeclampsia w/u. NST was reactive and BPP 8/8 with subjectively low normal AFI. Per pt report, was informed she had high blood pressure prior to pregnancy. She reported no additional f/u or medication. She was Rx'd daily Labetalol 100 mg with instructions to f/u in office 2 days later. At f/u visit she was scheduled  for IOL at 40 wks due [facility] med[facility] w/ close fetal surveillance until her induction date.    Last evaluation: Office on 06/07/14 by VL, CNM; cvx 1/50/-1. IOL scheduled on the eve of 06/16/14, BP 108/70.  OB History    Gravida Para Term Preterm AB TAB SAB Ectopic Multiple Living   1              Past Medical History  Diagnosis Date  . Hypertension     Hx in past, when stressed.  No meds  . 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 Wisdom tooth extraction     Family History: family history is negative for Cancer, Diabetes, and Hypertension.Malignant tumor of lung in her MGM, Leukemia in her maternal first cousin (female), Lupus erythematosus in her maternal first cousin (female) and HTN in her mother. Social History:  reports that she has never smoked. She has never used smokeless tobacco. She reports that she drinks alcohol. She reports that she does not use illicit drugs.Pt is a single AA female in the process of completing a double major 4-yr degree, works as a IT sales professionalsales associate and is of the [employer] faith. FOB involved and present.   Prenatal Transfer Tool  Maternal Diabetes: No Genetic Screening: Normal first trimester screen Maternal Ultrasounds/Referrals: Normal Fetal Ultrasounds or other Referrals:  None Maternal Substance Abuse:  No Significant Maternal Medications:  Meds include: Other: Iron, Labetalol, PNV, Valtrex Significant Maternal Lab Results: Lab values include:  Group B Strep positive, HSV-1  TDAP: Declined Flu: Declined  ROS: Contractions  No Known Allergies   Dilation: 6 Effacement (%): 90 Station: 0 Exam by:: k. Adler Alton,cnm Blood pressure 129/75, pulse 80, temperature 97.7 F (36.5 C), temperature source Oral, resp. rate 18, height  (1.702 m), weight 172 lb (78.019 kg), last menstrual period 08/11/2013, SpO2 100 %.  Chest clear Heart RRR without murmur Abd gravid, NT, FH CWD Pelvic: 3 cm on  admission per MAU RN Speculum exam: No lesions noted to vulva, vagina, cervix, perineum or perianal. Pt w/o prodrome sxs or outbreak since diagnosis (03/22/14) Ext: WNL  FHR: Category 1 UCs: q 1-2 min  Prenatal labs: ABO, Rh: B/Positive/-- (05/20 0000) Antibody: Negative (05/20 0000) Rubella:   Immune (10/14/13) RPR: Nonreactive (05/20 0000)  HBsAg: Negative (05/20 0000)  HIV: Non-reactive (05/20 0000)  GBS:  Positive Sickle cell/Hgb electrophoresis: AA Pap: Normal 01/2013 GC: Neg on 10/14/13 and 05/24/14 Chlamydia: Neg on 10/14/13 and 05/24/14 Genetic screenings: Neg first trimester screen Glucola: Normal at 77 Other: +HSV 1 on 03/22/14, GBS bacteriuria 01/20/14 Hgb 9.9 at NOB, 9.2 at 28 weeks EFW: 8 lbs    Assessment/Plan: IUP at 39.1 wks CHTN, controlled w/ Labetalol (last taken on 06/09/14) Early labor HSV, on Valtrex since 36 wks; no active lesions or prodrome sxs GBS positive Cat 1 FHRT  Plan: Admit to Birthing Suite per consult with Dr. Dion Body  Routine [facility] orders Pain med/epidural prn PCN G for GBS prophylaxis AROM when adequately treated for GBS and when comfortable w/ epidural  Robyne Askew, MS 06/10/2014, 05:15 AM

## 2014-06-11 LAB — CBC
HCT: 29.5 % — ABNORMAL LOW (ref 36.0–46.0)
Hemoglobin: 9.2 g/dL — ABNORMAL LOW (ref 12.0–15.0)
MCH: 24 pg — ABNORMAL LOW (ref 26.0–34.0)
MCHC: 31.2 g/dL (ref 30.0–36.0)
MCV: 77 fL — AB (ref 78.0–100.0)
Platelets: 235 10*3/uL (ref 150–400)
RBC: 3.83 MIL/uL — AB (ref 3.87–5.11)
RDW: 19.4 % — ABNORMAL HIGH (ref 11.5–15.5)
WBC: 15.2 10*3/uL — ABNORMAL HIGH (ref 4.0–10.5)

## 2014-06-11 LAB — RPR: RPR: NONREACTIVE

## 2014-06-11 MED ORDER — LABETALOL HCL 200 MG PO TABS
200.0000 mg | ORAL_TABLET | Freq: Two times a day (BID) | ORAL | Status: DC
  Administered 2014-06-11 – 2014-06-12 (×3): 200 mg via ORAL
  Filled 2014-06-11 (×3): qty 1

## 2014-06-11 NOTE — Discharge Summary (Signed)
Vaginal Delivery Discharge Summary  Katrina Bentley  DOB:    1991-02-13 MRN:    [id number] CSN:    [id number]  Date of admission:                  06/09/14  Date of discharge:                   06/12/14  Procedures this admission:   SVB, repair of bilateral labial and 1st degree perineal laceration  Date of Delivery: 06/10/14  Newborn Data:  Live born female  Birth Weight: 8 lb 5.7 oz (3790 g) APGAR: 2, 7  Home with mother. Name: Katrina Bentley Circumcision Plan: Outpatient  History of Present Illness:  Ms. Katrina Bentley is a 24 y.o. female, G1P1001, who presents at [redacted]w[redacted]d weeks gestation. The patient has been followed at the [facility] division of [facility]. She was admitted onset of labor. Her pregnancy has been complicated by:  Patient Active Problem List   Diagnosis Date Noted  . Normal labor 06/10/2014  . SVD (spontaneous vaginal delivery) 06/10/2014  . Obstetric labial laceration, delivered, current hospitalization 06/10/2014  . First degree perineal laceration during delivery 06/10/2014  . 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 06/05/2014  . Anemia 06/05/2014  . Chronic hypertension during pregnancy, antepartum 06/05/2014     Hospital Course:  Admitted 06/09/13 in early labor, with known chronic hypertension, on Labetalol 100 mg daily.Marland Kitchen Positive GBS. Progressed spontaneously and rapidly. Utilized epidural for pain management.  Delivery was performed by Gerrit Heck, CNM, without complication. Patient and baby tolerated the procedure without difficulty, with bilateral labial and 1st degree perineal laceration noted. Infant status was initially depressed, with Apgar of 2, but then responded quickly to NICU team efforts, with Apgar of 7 and 9, and remained in room with mother.  Patient was initially on Labetalol 100 mg po q day, but then increased to 200 mg po BID on day 1, with benefit to her BP.   Mother and infant then had an uncomplicated postpartum course, with breast feeding going well. Mom's physical exam was WNL, and she was discharged home in stable condition. Contraception plan was Micronor.  She received adequate benefit from po pain medications.  Smart Start RN will see patient next week for BP check.     Feeding:  breast  Contraception:  oral progesterone-only contraceptive  Discharge hemoglobin:  HEMOGLOBIN  Date Value Ref Range Status  06/11/2014 9.2* 12.0 - 15.0 g/dL Final   HCT  Date Value Ref Range Status  06/11/2014 29.5* 36.0 - 46.0 % Final    Discharge Physical Exam:   General: alert Lochia: appropriate Uterine Fundus: firm Incision: healing well DVT Evaluation: No evidence of DVT seen on physical exam. Negative Homan's sign.  Intrapartum Procedures: spontaneous vaginal delivery Postpartum Procedures: none Complications-Operative and Postpartum: Bilateral labial and 1st  degree perineal laceration, mild anemia  Discharge Diagnoses: Term Pregnancy-delivered and chronic hypertension, anemia  Discharge Information:  Activity:           pelvic rest Diet:                routine Medications: Ibuprofen and Labetalol 200 mg po BID, Micronor for contraception.  To continue Fe daily as tolerated.  OTC Ibuprophen Condition:      stable Instructions:     Discharge to: home  Follow-up Information    Follow up with [facility] In  6 weeks.   Specialty:  Obstetrics and Gynecology   Why:  Call to schedule circumcision.  Call for any questions or concerns.  Smart Start RN will see you next week for a blood pressure check.   Contact information:   [address] [place] [id number] [phone]       Nigel BridgemanLATHAM, Edom Schmuhl [facility] Center[facility] 06/12/2014 8:15 AM

## 2014-06-11 NOTE — Anesthesia Postprocedure Evaluation (Signed)
Anesthesia Post Note  Patient: Katrina Bentley  Procedure(s) Performed: * No procedures listed *  Anesthesia type: Epidural  Patient location: Mother/Baby  Post pain: Pain level controlled  Post assessment: Post-op Vital signs reviewed  Last Vitals:  Filed Vitals:   06/11/14 0619  BP: 110/61  Pulse: 86  Temp: 36.8 C  Resp: 16    Post vital signs: Reviewed  Level of consciousness:alert  Complications: No apparent anesthesia complications

## 2014-06-11 NOTE — Progress Notes (Addendum)
Subjective: Postpartum Day 1: Vaginal delivery, bilateral labial and 1st degree perineal laceration Patient up ad lib, reports no syncope or dizziness.  Denies HA, visual sx, or epigastric pain. Feeding:  Breast Contraceptive plan:  Undecided  Plans outpatient circumcision  Objective: Vital signs in last 24 hours: Temp:  [97.7 F (36.5 C)-98.8 F (37.1 C)] 98.2 F (36.8 C) (01/15 0619) Pulse Rate:  [60-86] 86 (01/15 0619) Resp:  [16-20] 16 (01/15 0619) BP: (110-146)/(61-109) 110/61 mmHg (01/15 16100619)   Filed Vitals:   06/10/14 1230 06/10/14 2100 06/10/14 2356 06/11/14 0619  BP: 128/93 136/98 130/91 110/61  Pulse: 63 68 69 86  Temp: 98.2 F (36.8 C) 98.8 F (37.1 C) 98.6 F (37 C) 98.2 F (36.8 C)  TempSrc: Oral Oral Oral Oral  Resp: 20 18 20 16   Height:      Weight:      SpO2:       1st dose Labetalol 100 mg at 2253--next dose due 1000  Physical Exam:  General: alert Lochia: appropriate Uterine Fundus: firm Perineum: healing well DVT Evaluation: No evidence of DVT seen on physical exam. Negative Homan's sign.    Recent Labs  06/10/14 0215 06/11/14 0520  HGB 11.0* 9.2*  HCT 36.4 29.5*    Assessment/Plan: Status post vaginal delivery day 1. Chronic hypertension--on Labetalol 100 mg q day. Mild anemia, stable hemodynamically  Plan: Will consult regarding Labetalol dose Anticipate d/c tomorrow  Nyra CapesLATHAM, VICKICNM 06/11/2014, 8:51 AM  Addendum: Consulted with Dr. Normand Sloopillard. Increase Labetalol to 200 mg po BID.  Nigel BridgemanVicki Tinleigh Whitmire, CNM 06/11/14 9:15a

## 2014-06-11 NOTE — Lactation Note (Signed)
This note was copied from the chart of Boy Jaclynn Majorlyessa Bevill. Lactation Consultation Note  Patient Name: Boy Jaclynn Majorlyessa Barkalow [id number]'s Date: 06/11/2014 Reason for consult: Initial assessment Lactation visit to see mother who has had her first baby. She reports baby is breastfeeding well. She denies any discomfort with the latch, feels a tug on the breast and baby is calm following the feeding. She said her nurses have watched her feed and states baby is feeding well. Baby asleep in mother's arms and fed within the last hour. Basic breastfeeding teaching done and answered questions. Instructed mother to call for nurse to assess the next feeding. Mother informed of post-discharge support and given phone number to the lactation department, including services for phone call assistance; out-patient appointments; and breastfeeding support group. List of other breastfeeding resources in the community given in the handout. Encouraged mother to call for problems or concerns related to breastfeeding. Lactation to see patient PRN, currently breastfeeding progressing well.   Maternal Data    Feeding Feeding Type: Breast Fed Length of feed: 50 min  LATCH Score/Interventions                      Lactation Tools Discussed/Used WIC Program: Yes Pump Review: Milk Storage (educated about milk storage by Monsanto CompanyBDaly, RN)   Consult Status Consult Status: PRN    Omar PersonDaly, Cheyanna Strick M 06/11/2014, 2:15 PM

## 2014-06-12 MED ORDER — FERROUS SULFATE 325 (65 FE) MG PO TABS
325.0000 mg | ORAL_TABLET | Freq: Two times a day (BID) | ORAL | Status: DC
  Administered 2014-06-12: 325 mg via ORAL
  Filled 2014-06-12: qty 1

## 2014-06-12 MED ORDER — NORETHINDRONE 0.35 MG PO TABS: 1.0000 | ORAL_TABLET | Freq: Every day | ORAL | Status: AC

## 2014-06-12 MED ORDER — LABETALOL HCL 200 MG PO TABS: 200.0000 mg | ORAL_TABLET | Freq: Two times a day (BID) | ORAL | Status: AC

## 2014-06-12 NOTE — Lactation Note (Signed)
This note was copied from the chart of Katrina Jaclynn Majorlyessa Heasley. Lactation Consultation Note: Follow up visit with mom before DC. She reports baby has been nursing well with no pain. Reports breasts are feeling heavier this morning,.Reviewed engorgement prevention and treatment. Has Medela PIS at home. No further questions at present. To call prn  Patient Name: Katrina Bentley [id number]'s Date: 06/12/2014 Reason for consult: Follow-up assessment   Maternal Data Formula Feeding for Exclusion: No  Feeding   LATCH Score/Interventions                      Lactation Tools Discussed/Used     Consult Status Consult Status: Complete    Katrina Bentley, Katrina Bentley 06/12/2014, 11:03 AM

## 2014-06-12 NOTE — Discharge Instructions (Signed)
Postpartum Care After Vaginal Delivery °After you deliver your newborn (postpartum period), the usual stay in the hospital is 24-72 hours. If there were problems with your labor or delivery, or if you have other medical problems, you might be in the hospital longer.  °While you are in the hospital, you will receive help and instructions on how to care for yourself and your newborn during the postpartum period.  °While you are in the hospital: °· Be sure to tell your nurses if you have pain or discomfort, as well as where you feel the pain and what makes the pain worse. °· If you had an incision made near your vagina (episiotomy) or if you had some tearing during delivery, the nurses may put ice packs on your episiotomy or tear. The ice packs may help to reduce the pain and swelling. °· If you are breastfeeding, you may feel uncomfortable contractions of your uterus for a couple of weeks. This is normal. The contractions help your uterus get back to normal size. °· It is normal to have some bleeding after delivery. °· For the first 1-3 days after delivery, the flow is red and the amount may be similar to a period. °· It is common for the flow to start and stop. °· In the first few days, you may pass some small clots. Let your nurses know if you begin to pass large clots or your flow increases. °· Do not  flush blood clots down the toilet before having the nurse look at them. °· During the next 3-10 days after delivery, your flow should become more watery and pink or brown-tinged in color. °· Ten to fourteen days after delivery, your flow should be a small amount of yellowish-white discharge. °· The amount of your flow will decrease over the first few weeks after delivery. Your flow may stop in 6-8 weeks. Most women have had their flow stop by 12 weeks after delivery. °· You should change your sanitary pads frequently. °· Wash your hands thoroughly with soap and water for at least 20 seconds after changing pads, using  the toilet, or before holding or feeding your newborn. °· You should feel like you need to empty your bladder within the first 6-8 hours after delivery. °· In case you become weak, lightheaded, or faint, call your nurse before you get out of bed for the first time and before you take a shower for the first time. °· Within the first few days after delivery, your breasts may begin to feel tender and full. This is called engorgement. Breast tenderness usually goes away within 48-72 hours after engorgement occurs. You may also notice milk leaking from your breasts. If you are not breastfeeding, do not stimulate your breasts. Breast stimulation can make your breasts produce more milk. °· Spending as much time as possible with your newborn is very important. During this time, you and your newborn can feel close and get to know each other. Having your newborn stay in your room (rooming in) will help to strengthen the bond with your newborn.  It will give you time to get to know your newborn and become comfortable caring for your newborn. °· Your hormones change after delivery. Sometimes the hormone changes can temporarily cause you to feel sad or tearful. These feelings should not last more than a few days. If these feelings last longer than that, you should talk to your caregiver. °· If desired, talk to your caregiver about methods of family planning or contraception. °·   Talk to your caregiver about immunizations. Your caregiver may want you to have the following immunizations before leaving the hospital: °· Tetanus, diphtheria, and pertussis (Tdap) or tetanus and diphtheria (Td) immunization. It is very important that you and your family (including grandparents) or others caring for your newborn are up-to-date with the Tdap or Td immunizations. The Tdap or Td immunization can help protect your newborn from getting ill. °· Rubella immunization. °· Varicella (chickenpox) immunization. °· Influenza immunization. You should  receive this annual immunization if you did not receive the immunization during your pregnancy. °Document Released: 03/11/2007 Document Revised: 02/06/2012 Document Reviewed: 01/09/2012 °[employer]® Patient Information ©2015 [employer]. This information is not intended to replace advice given to you by your health care provider. Make sure you discuss any questions you have with your health care provider. ° °Hypertension During Pregnancy °Hypertension, or high blood pressure, is when there is extra pressure inside your blood vessels that carry blood from the heart to the rest of your body (arteries). It can happen at any time in life, including pregnancy. Hypertension during pregnancy can cause problems for you and your baby. Your baby might not weigh as much as he or she should at birth or might be born early (premature). Very bad cases of hypertension during pregnancy can be life-threatening.  °Different types of hypertension can occur during pregnancy. These include: °· Chronic hypertension. This happens when a woman has hypertension before pregnancy and it continues during pregnancy. °· Gestational hypertension. This is when hypertension develops during pregnancy. °· Preeclampsia or toxemia of pregnancy. This is a very serious type of hypertension that develops only during pregnancy. It affects the whole body and can be very dangerous for both mother and baby.   °Gestational hypertension and preeclampsia usually go away after your baby is born. Your blood pressure will likely stabilize within 6 weeks. Women who have hypertension during pregnancy have a greater chance of developing hypertension later in life or with future pregnancies. °RISK FACTORS °There are certain factors that make it more likely for you to develop hypertension during pregnancy. These include: °· Having hypertension before pregnancy. °· Having hypertension during a previous pregnancy. °· Being overweight. °· Being older than 40  years. °· Being pregnant with more than one baby. °· Having diabetes or kidney problems. °SIGNS AND SYMPTOMS °Chronic and gestational hypertension rarely cause symptoms. Preeclampsia has symptoms, which may include: °· Increased protein in your urine. Your health care provider will check for this at every prenatal visit. °· Swelling of your hands and face. °· Rapid weight gain. °· Headaches. °· Visual changes. °· Being bothered by light. °· Abdominal pain, especially in the upper right area. °· Chest pain. °· Shortness of breath. °· Increased reflexes. °· Seizures. These occur with a more severe form of preeclampsia, called eclampsia. °DIAGNOSIS  °You may be diagnosed with hypertension during a regular prenatal exam. At each prenatal visit, you may have: °· Your blood pressure checked. °· A urine test to check for protein in your urine. °The type of hypertension you are diagnosed with depends on when you developed it. It also depends on your specific blood pressure reading. °· Developing hypertension before 20 weeks of pregnancy is consistent with chronic hypertension. °· Developing hypertension after 20 weeks of pregnancy is consistent with gestational hypertension. °· Hypertension with increased urinary protein is diagnosed as preeclampsia. °· Blood pressure measurements that stay above 160 systolic or 110 diastolic are a sign of severe preeclampsia. °TREATMENT °Treatment for hypertension during pregnancy   varies. Treatment depends on the type of hypertension and how serious it is.  If you take medicine for chronic hypertension, you may need to switch medicines.  Medicines called ACE inhibitors should not be taken during pregnancy.  Low-dose aspirin may be suggested for women who have risk factors for preeclampsia.  If you have gestational hypertension, you may need to take a blood pressure medicine that is safe during pregnancy. Your health care provider will recommend the correct medicine.  If you have  severe preeclampsia, you may need to be in the hospital. Health care providers will watch you and your baby very closely. You also may need to take medicine called magnesium sulfate to prevent seizures and lower blood pressure.  Sometimes, an early delivery is needed. This may be the case if the condition worsens. It would be done to protect you and your baby. The only cure for preeclampsia is delivery.  Your health care provider may recommend that you take one low-dose aspirin (81 mg) each day to help prevent high blood pressure during your pregnancy if you are at risk for preeclampsia. You may be at risk for preeclampsia if:  You had preeclampsia or eclampsia during a previous pregnancy.  Your baby did not grow as expected during a previous pregnancy.  You experienced preterm birth with a previous pregnancy.  You experienced a separation of the placenta from the uterus (placental abruption) during a previous pregnancy.  You experienced the loss of your baby during a previous pregnancy.  You are pregnant with more than one baby.  You have other medical conditions, such as diabetes or an autoimmune disease. HOME CARE INSTRUCTIONS  Schedule and keep all of your regular prenatal care appointments. This is important.  Take medicines only as directed by your health care provider. Tell your health care provider about all medicines you take.  Eat as little salt as possible.  Get regular exercise.  Do not drink alcohol.  Do not use tobacco products.  Do not drink products with caffeine.  Lie on your left side when resting. SEEK IMMEDIATE MEDICAL CARE IF:  You have severe abdominal pain.  You have sudden swelling in your hands, ankles, or face.  You gain 4 pounds (1.8 kg) or more in 1 week.  You vomit repeatedly.  You have vaginal bleeding.  You have a severe headache, not taken care of by medication.  You have blurred or double vision.  You have muscle twitching or  spasms.  You have shortness of breath.  You have blue fingernails or lips.  You have blood in your urine. MAKE SURE YOU:  Understand these instructions.  Will watch your condition.  Will get help right away if you are not doing well or get worse. Document Released: 01/30/2011 Document Revised: 09/28/2013 Document Reviewed: 12/11/2012 [facility] Patient Information 2015 [employer], [place]. This information is not intended to replace advice given to you by your health care provider. Make sure you discuss any questions you have with your health care provider.

## 2014-06-16 ENCOUNTER — Inpatient Hospital Stay (HOSPITAL_COMMUNITY): Admission: RE | Admit: 2014-06-16 | Payer: 59 | Source: Ambulatory Visit

## 2015-12-17 IMAGING — US US OB LIMITED
1 series · 13 of 25 positions shown · non-contrast
Comparison: none

OBSTETRICS REPORT
                      (Signed Final 06/07/2014 [DATE])

Service(s) Provided
 [HOSPITAL]                                         76815.0
Indications
 Unspecified maternal hypertension, third trimester
 Spontaneous  Rupture of Membranes - leaking
 fluid (negative Amnisure)
 Non-reactive NST, FHR decelerations (late)
 38 weeks gestation of pregnancy
Fetal Evaluation
 Num Of Fetuses:    1
 Fetal Heart Rate:  132                          bpm
 Cardiac Activity:  Observed
 Presentation:      Cephalic
 Placenta:          Anterior, above cervical os
 P. Cord            Not well visualized
 Insertion:
 Amniotic Fluid
 AFI FV:      Subjectively low-normal
 AFI Sum:     7.55    cm                     Larg Pckt:    2.61  cm
 RUQ:   2.61    cm   RLQ:    0.94   cm    LUQ:   1.46    cm   LLQ:    2.54   cm
Biophysical Evaluation
 Amniotic F.V:   Pocket => 2 cm two         F. Tone:         Observed
                 planes
 F. Movement:    Observed                   Score:           [DATE]
 F. Breathing:   Observed
Cervix Uterus Adnexa
 Cervix:       Not visualized (advanced GA >70wks)
 Uterus:       No abnormality visualized.
 Left Ovary:    Not visualized.
 Right Ovary:   Not visualized.
 Adnexa:     No abnormality visualized.
Impression
INDICATION: 23 yr old G1P0 at 44w4d with chronic
 hypertension for biophysical profile.

[Series 1: us ob limited · 0.21mm/px · 25 acquisitions, 13 frames shown]
[im 1/25]
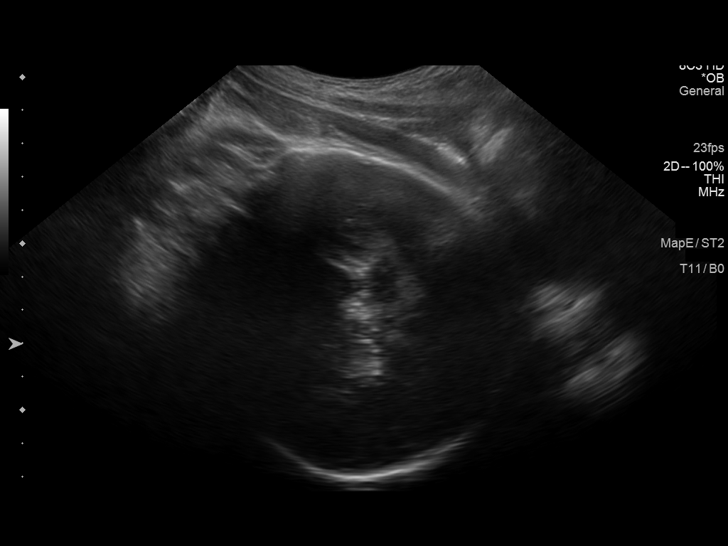
[im 3/25]
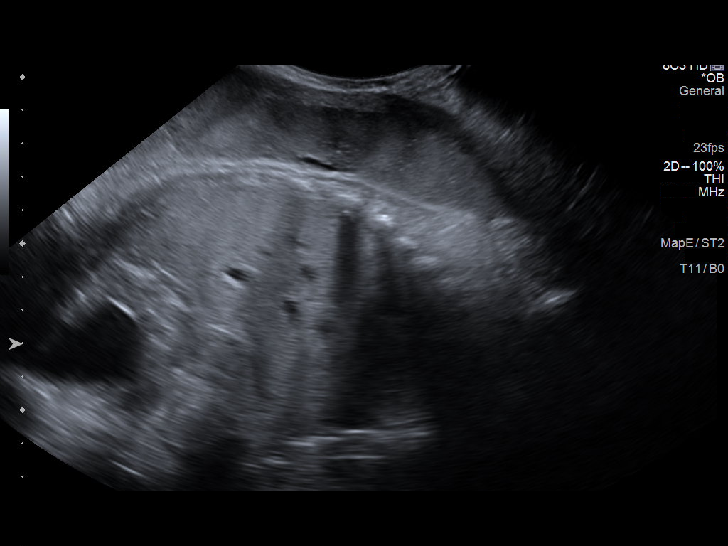
[im 5/25]
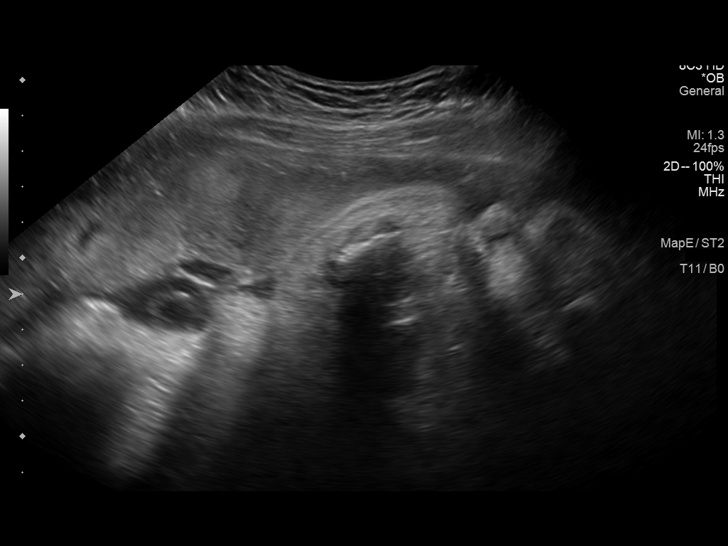
[im 7/25]
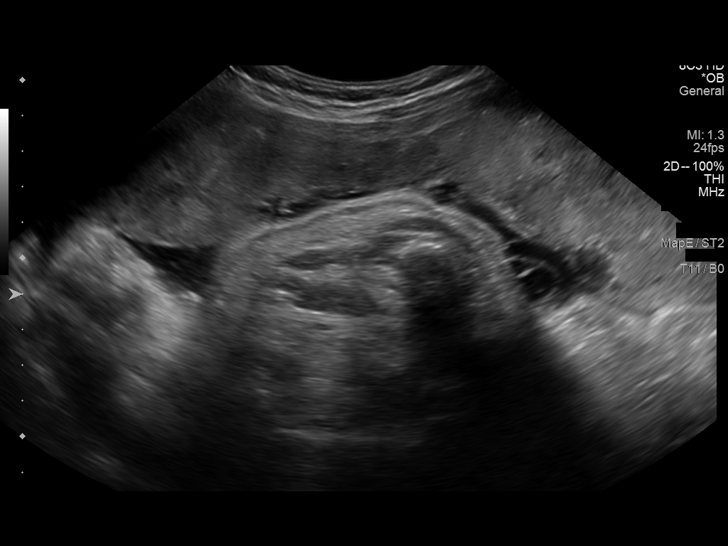
[im 9/25]
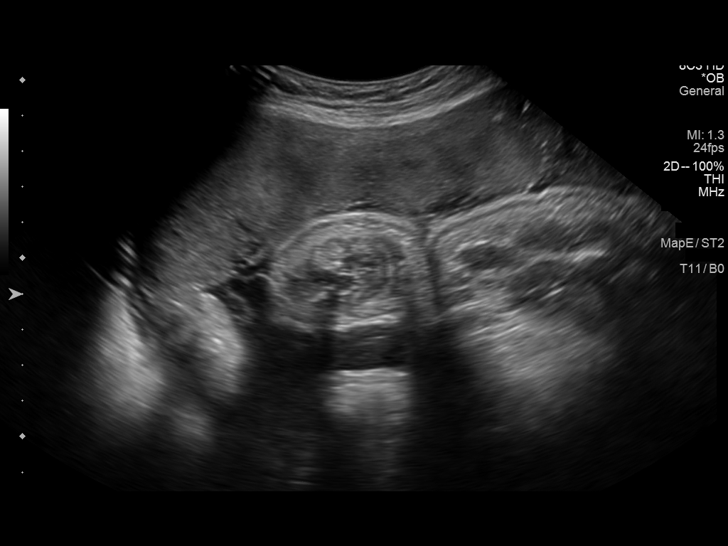
[im 11/25]
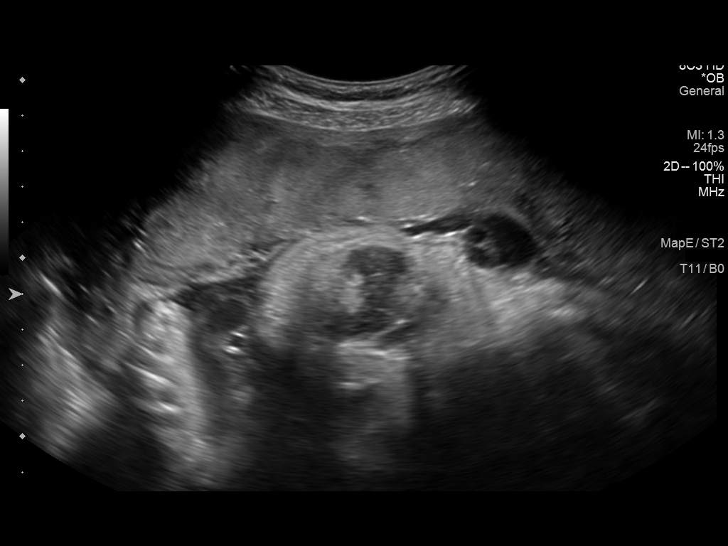
[im 13/25]
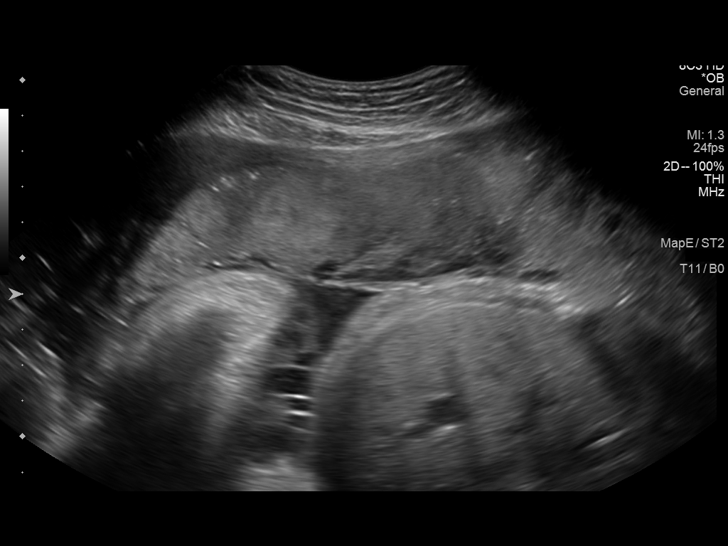
[im 15/25]
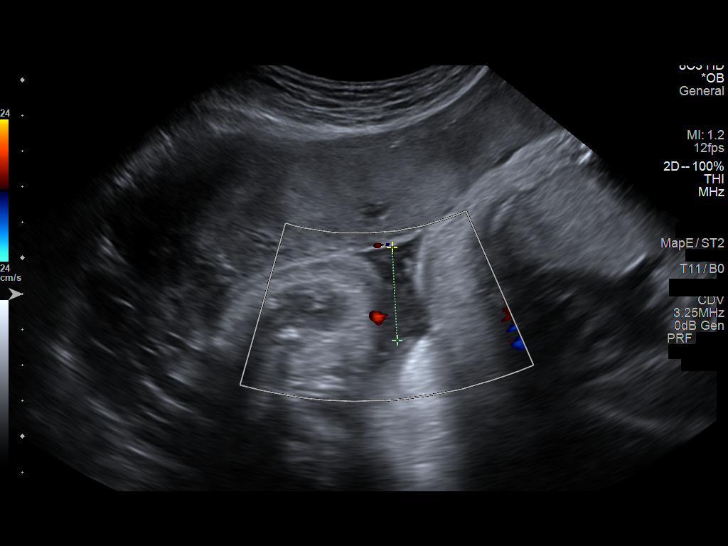
[im 17/25]
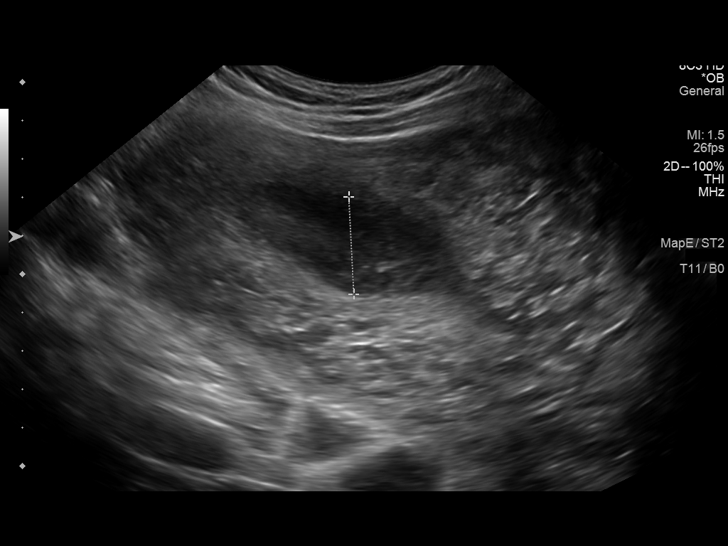
[im 19/25]
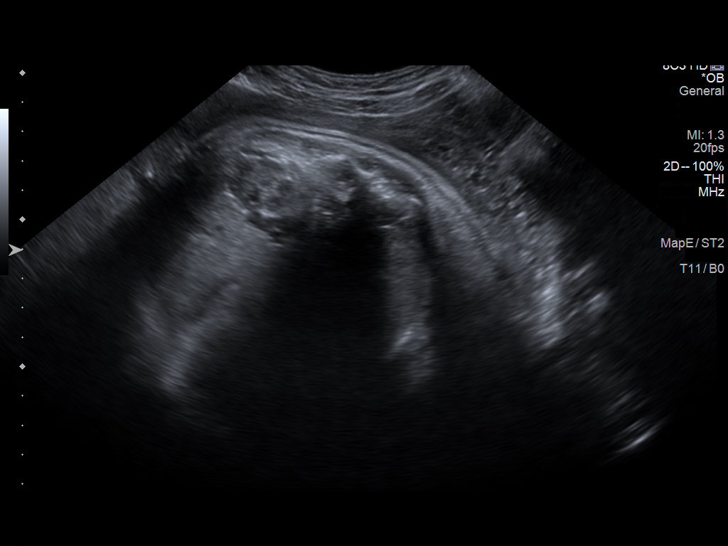
[im 21/25]
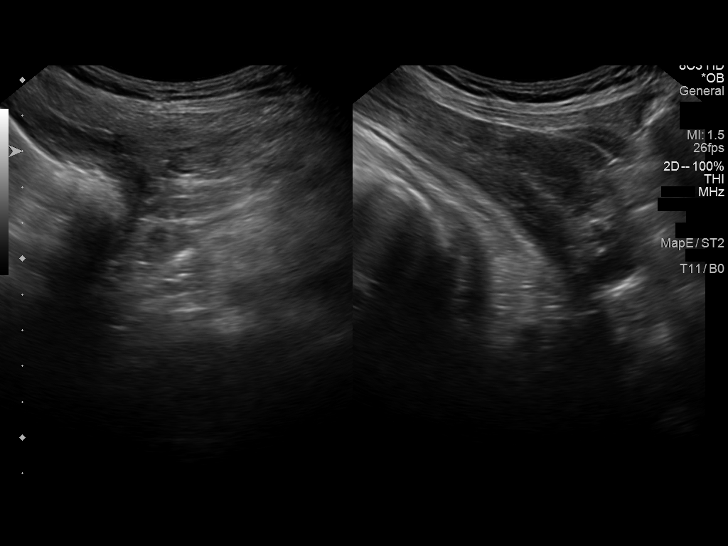
[im 23/25]
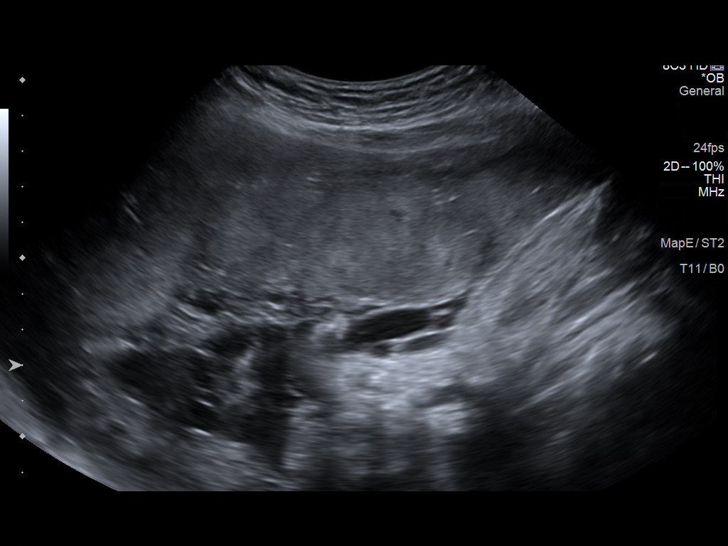
[im 25/25]
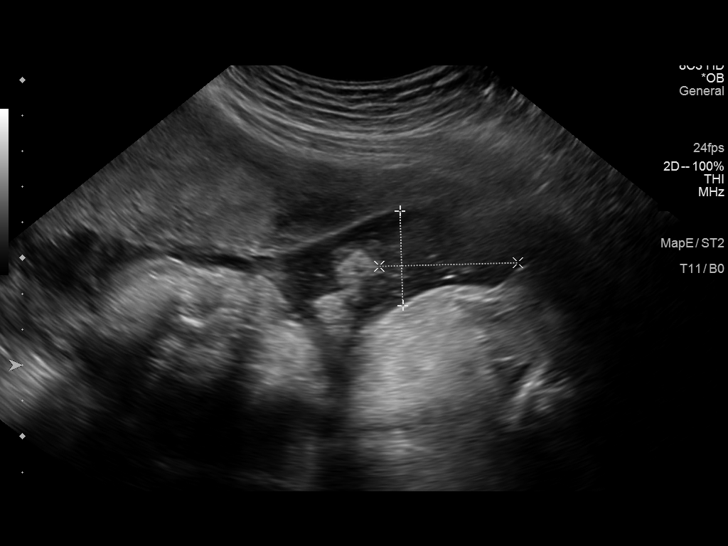

[13 of 25 positions shown; findings below may reference images not displayed]

FINDINGS: 1. Single intrauterine pregnancy.
 2. Anterior placenta without evidence of previa.
 3. Normal amniotic fluid index; although decreased for
 gestational age.
 4. Fetus is in cephalic presentation.
 5. Normal biophysical profile of [DATE].
Recommendations

 1. Normal biophysical profile.
 2. Management per primary OB.
